# Patient Record
Sex: Female | Born: 1990 | Race: White | Hispanic: No | Marital: Married | Smoking: Former smoker
Health system: Southern US, Community
[De-identification: ages and names within clinical notes are randomized; demographics above are authoritative.]

## PROBLEM LIST (undated history)

## (undated) DIAGNOSIS — Z973 Presence of spectacles and contact lenses: Secondary | ICD-10-CM

## (undated) DIAGNOSIS — Z8759 Personal history of other complications of pregnancy, childbirth and the puerperium: Secondary | ICD-10-CM

## (undated) DIAGNOSIS — L988 Other specified disorders of the skin and subcutaneous tissue: Secondary | ICD-10-CM

## (undated) DIAGNOSIS — K219 Gastro-esophageal reflux disease without esophagitis: Secondary | ICD-10-CM

## (undated) DIAGNOSIS — J45998 Other asthma: Secondary | ICD-10-CM

## (undated) DIAGNOSIS — F53 Postpartum depression: Secondary | ICD-10-CM

## (undated) HISTORY — PX: WISDOM TOOTH EXTRACTION: SHX21

---
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
## 2010-05-24 ENCOUNTER — Emergency Department (HOSPITAL_COMMUNITY)
Admission: EM | Admit: 2010-05-24 | Discharge: 2010-05-24 | Payer: Self-pay | Source: Home / Self Care | Admitting: Emergency Medicine

## 2011-05-16 LAB — POCT URINALYSIS DIP (DEVICE)
Bilirubin Urine: NEGATIVE
Glucose, UA: NEGATIVE
Hgb urine dipstick: NEGATIVE
Ketones, ur: NEGATIVE
Nitrite: NEGATIVE
Operator id: 116391
Protein, ur: NEGATIVE
Specific Gravity, Urine: 1.015
Urobilinogen, UA: 0.2
pH: 7

## 2015-03-03 DIAGNOSIS — F17210 Nicotine dependence, cigarettes, uncomplicated: Secondary | ICD-10-CM | POA: Diagnosis present

## 2016-11-09 ENCOUNTER — Other Ambulatory Visit: Payer: Self-pay | Admitting: Family

## 2016-11-09 ENCOUNTER — Ambulatory Visit
Admission: RE | Admit: 2016-11-09 | Discharge: 2016-11-09 | Disposition: A | Discharge status: Home / Self Care | Payer: BC Managed Care – PPO | Source: Ambulatory Visit | Attending: Family | Admitting: Family

## 2016-11-09 DIAGNOSIS — R05 Cough: Secondary | ICD-10-CM

## 2016-11-09 DIAGNOSIS — R059 Cough, unspecified: Secondary | ICD-10-CM

## 2016-11-09 DIAGNOSIS — R509 Fever, unspecified: Secondary | ICD-10-CM

## 2016-11-09 DIAGNOSIS — R058 Other specified cough: Secondary | ICD-10-CM

## 2016-11-28 ENCOUNTER — Ambulatory Visit (INDEPENDENT_AMBULATORY_CARE_PROVIDER_SITE_OTHER): Payer: BC Managed Care – PPO | Admitting: Physician Assistant

## 2016-11-28 VITALS — BP 123/82 | HR 79 | Temp 98.9°F | Resp 16 | Ht 64.0 in | Wt 213.4 lb

## 2016-11-28 DIAGNOSIS — Z3009 Encounter for other general counseling and advice on contraception: Secondary | ICD-10-CM

## 2016-11-28 DIAGNOSIS — Z3046 Encounter for surveillance of implantable subdermal contraceptive: Secondary | ICD-10-CM | POA: Diagnosis not present

## 2016-11-28 DIAGNOSIS — N920 Excessive and frequent menstruation with regular cycle: Secondary | ICD-10-CM

## 2016-11-28 NOTE — Patient Instructions (Signed)
     IF you received an x-ray today, you will receive an invoice from [facility]. Please contact [facility] at [phone] with questions or concerns regarding your invoice.   IF you received labwork today, you will receive an invoice from [facility]. Please contact [facility] at [phone] with questions or concerns regarding your invoice.   Our billing staff will not be able to assist you with questions regarding bills from these companies.  You will be contacted with the lab results as soon as they are available. The fastest way to get your results is to activate your My Chart account. Instructions are located on the last page of this paperwork. If you have not heard from us regarding the results in 2 weeks, please contact this office.     

## 2016-12-03 NOTE — Progress Notes (Signed)
PRIMARY CARE AT [facility] [address] [place] [address] [phone]  Date:  11/28/2016   Name:  Lisa Powers   DOB:  [birth year]   MRN:  [id number]  PCP:  No PCP Per Patient    History of Present Illness:  Lisa Powers is a 26 y.o. female patient who presents to PCP with  Chief Complaint  Patient presents with  . Nexplanon Removal    Left arm, placed in January. Removing it bc she would like to have a baby/ period has been lasting x 1 month     Patient is here to have nexplanon removed.  She had this placed 4 months ago.  Has complaint of intermittent bleeding which has been frustrating.  more so, she states that she plans to conceive in the next up and coming months.  There are no active problems to display for this patient.   Past Medical History:  Diagnosis Date  . Allergy   . Asthma     No past surgical history on file.  Social History  Substance Use Topics  . Smoking status: Current Every Day Smoker  . Smokeless tobacco: Never Used  . Alcohol use Not on file    Family History  Problem Relation Age of Onset  . Hyperlipidemia Mother   . Hyperlipidemia Father   . Heart disease Maternal Grandmother     Allergies  Allergen Reactions  . Claritin [Loratadine]     Medication list has been reviewed and updated.  No current outpatient prescriptions on file prior to visit.   No current facility-administered medications on file prior to visit.     ROS ROS otherwise unremarkable unless listed above.  Physical Examination: BP 123/82   Pulse 79   Temp 98.9 F (37.2 C) (Oral)   Resp 16   Ht  (1.626 m)   Wt 213 lb 6.4 oz (96.8 kg)   LMP 11/28/2016   SpO2 99%   BMI 36.63 kg/m  Ideal Body Weight: Weight in (lb) to have BMI = 25: 145.3  Physical Exam  Constitutional: She is oriented to person, place, and time. She appears well-developed and well-nourished. No distress.  HENT:  Head: Normocephalic and atraumatic.  Right Ear: External ear normal.   Left Ear: External ear normal.  Eyes: Conjunctivae and EOM are normal. Pupils are equal, round, and reactive to light.  Cardiovascular: Normal rate.   Pulmonary/Chest: Effort normal. No respiratory distress.  Neurological: She is alert and oriented to person, place, and time.  Skin: She is not diaphoretic.  Palpable and visible nexplanon subdermal at the medial upper left arm.    Psychiatric: She has a normal mood and affect. Her behavior is normal.   Procedure:verbal consent obtained.  Alcohol swabbed.  1% lidocaine placed at the nexplanon site.  povidone swabbed.  11 blade utilized to place .5cm incision.  Palpated to obtain the nexplanon.  This was manipulated several times.  Opposite end with incision placed, but continued difficulty to remove.  Deeper incision was able to get the tip of nexplanon above the dermis for retrieval.  Cleansed with normal saline.  Steri strips placed, and pressure dressing.     Assessment and Plan: Lisa Powers is a 26 y.o. female who is here today for nexplanon removal.  --advised to wear condoms until menses can regulate, prior to preparing for conceiving.    Menorrhagia with regular cycle  Nexplanon removal  Family planning  Trena Platt, PA-C [facility] 4/15/20189:20 PM

## 2016-12-17 ENCOUNTER — Ambulatory Visit (HOSPITAL_COMMUNITY)
Admission: EM | Admit: 2016-12-17 | Discharge: 2016-12-17 | Disposition: A | Discharge status: Home / Self Care | Payer: BC Managed Care – PPO | Attending: Internal Medicine | Admitting: Internal Medicine

## 2016-12-17 ENCOUNTER — Encounter (HOSPITAL_COMMUNITY): Payer: Self-pay | Admitting: *Deleted

## 2016-12-17 DIAGNOSIS — L0501 Pilonidal cyst with abscess: Secondary | ICD-10-CM | POA: Diagnosis not present

## 2016-12-17 MED ORDER — CEPHALEXIN 500 MG PO CAPS: 500.0000 mg | ORAL_CAPSULE | Freq: Two times a day (BID) | ORAL | 0 refills | Status: DC

## 2016-12-17 MED ORDER — HYDROCODONE-ACETAMINOPHEN 5-325 MG PO TABS: 1.0000 | ORAL_TABLET | Freq: Four times a day (QID) | ORAL | 0 refills | Status: DC | PRN

## 2016-12-17 MED ORDER — SULFAMETHOXAZOLE-TRIMETHOPRIM 800-160 MG PO TABS: 1.0000 | ORAL_TABLET | Freq: Two times a day (BID) | ORAL | 0 refills | Status: DC

## 2016-12-17 NOTE — ED Notes (Signed)
Dr Dayton Scrape at bedside for I&D

## 2016-12-17 NOTE — ED Triage Notes (Signed)
Patient reports abscess to gluteal fold, no drainage. States has had one in the past.

## 2016-12-17 NOTE — ED Provider Notes (Signed)
[facility]    CSN: [id number] Arrival date & time: 12/17/16  1202     History   Chief Complaint Chief Complaint  Patient presents with  . Abscess    HPI Lisa Powers is a 26 y.o. female. She presents today with 3-4 day history of increasing pain in her gluteal crease. Hard to walk, painful to sit. One prior abscess, in the same location. No fever, no malaise.    HPI  Past Medical History:  Diagnosis Date  . Allergy   . Asthma     History reviewed. No pertinent surgical history.    Home Medications    Prior to Admission medications   Medication Sig Start Date End Date Taking? Authorizing Provider  cephALEXin (KEFLEX) 500 MG capsule Take 1 capsule (500 mg total) by mouth 2 (two) times daily. 12/17/16   Eustace Moore, MD  HYDROcodone-acetaminophen (NORCO/VICODIN) 5-325 MG tablet Take 1 tablet by mouth 4 (four) times daily as needed. 12/17/16   Eustace Moore, MD  Levocetirizine Dihydrochloride (XYZAL PO) Take by mouth.    Historical Provider, MD  UNABLE TO FIND Mono Histamine    Historical Provider, MD    Family History Family History  Problem Relation Age of Onset  . Hyperlipidemia Mother   . Hyperlipidemia Father   . Heart disease Maternal Grandmother     Social History Social History  Substance Use Topics  . Smoking status: Current Every Day Smoker  . Smokeless tobacco: Never Used  . Alcohol use No     Allergies   Claritin [loratadine]   Review of Systems Review of Systems  All other systems reviewed and are negative.    Physical Exam Triage Vital Signs ED Triage Vitals [12/17/16 1226]  Enc Vitals Group     BP 135/88     Pulse Rate 95     Resp 17     Temp 98.3 F (36.8 C)     Temp Source Oral     SpO2 100 %     Weight      Height      Pain Score 7     Pain Loc    Updated Vital Signs BP 135/88 (BP Location: Left Arm)   Pulse 95   Temp 98.3 F (36.8 C) (Oral)   Resp 17   LMP 11/28/2016   SpO2 100%   Physical Exam   Constitutional: She is oriented to person, place, and time. No distress.  HENT:  Head: Atraumatic.  Eyes:  Conjugate gaze observed, no eye redness/discharge  Neck: Neck supple.  Cardiovascular: Normal rate.   Pulmonary/Chest: No respiratory distress.  Abdominal: She exhibits no distension.  Musculoskeletal: Normal range of motion.  Neurological: She is alert and oriented to person, place, and time.  Skin: Skin is warm and dry.  At the top of the gluteal cleft, there is a 3 inch area of redness/induration, pointing at the lower left. No current drainage. Feels fluctuant, quite tender, fluctuance area extends about an inch outside of the erythematous area.  Nursing note and vitals reviewed.    UC Treatments / Results   Procedures Procedures (including critical care time) Skin prepped with Hibiclens, and anesthetized with ethyl chloride spray. Infiltrated with 2% lidocaine. A stab incision approximately 0.5 cm long was made. A large quantity of purulent material was expressed, and a small strip of packing placed. Antibiotic ointment and a dry dressing were applied.    Final Clinical Impressions(s) / UC Diagnoses  Final diagnoses:  Pilonidal cyst with abscess   Recheck abscess at urgent care in 2d (12/19/16).  Keep wound covered/dry until then.  If needed can add extra gauze to bandage or replace with fresh bandage.  Prescription for keflex sent to [facility] on [facility].  Prescription for a few vicodin, for severe pain, printed.  Can also take tylenol, aleve, or advil for pain.  Anticipate gradual improvement in abscess pain/redness/swelling over the next several days, may take a week or two to really resolve.  Recheck for new fever >100.5 or increasing redness/swelling/drainage/pain, especially after the first 48 hours or so.    New Prescriptions Discharge Medication List as of 12/17/2016  1:40 PM    START taking these medications   Details  cephALEXin (KEFLEX) 500 MG capsule Take  1 capsule (500 mg total) by mouth 2 (two) times daily., Starting Sun 12/17/2016, Normal         Eustace Moore, MD 12/20/16 [phone]

## 2016-12-17 NOTE — Discharge Instructions (Addendum)
Recheck abscess at urgent care in 2d (12/19/16).  Keep wound covered/dry until then.  If needed can add extra gauze to bandage or replace with fresh bandage.  Prescription for keflex sent to [facility] on [facility]  Prescription for a few vicodin, for severe pain, printed.  Can also take tylenol, aleve, or advil for pain.  Anticipate gradual improvement in abscess pain/redness/swelling over the next several days, may take a week or two to really resolve.  Recheck for new fever >100.5 or increasing redness/swelling/drainage/pain, especially after the first 48 hours or so.

## 2017-03-21 LAB — OB RESULTS CONSOLE GC/CHLAMYDIA
CHLAMYDIA, DNA PROBE: NEGATIVE
GC PROBE AMP, GENITAL: NEGATIVE

## 2017-03-21 LAB — OB RESULTS CONSOLE ABO/RH: "RH Type ": POSITIVE

## 2017-03-21 LAB — OB RESULTS CONSOLE RUBELLA ANTIBODY, IGM: Rubella: IMMUNE

## 2017-03-21 LAB — OB RESULTS CONSOLE HEPATITIS B SURFACE ANTIGEN: Hepatitis B Surface Ag: NEGATIVE

## 2017-03-21 LAB — OB RESULTS CONSOLE HIV ANTIBODY (ROUTINE TESTING): HIV: NONREACTIVE

## 2017-03-21 LAB — OB RESULTS CONSOLE ANTIBODY SCREEN: ANTIBODY SCREEN: NEGATIVE

## 2017-03-21 LAB — OB RESULTS CONSOLE RPR: RPR: NONREACTIVE

## 2017-08-21 DIAGNOSIS — F53 Postpartum depression: Secondary | ICD-10-CM

## 2017-08-21 DIAGNOSIS — O99345 Other mental disorders complicating the puerperium: Secondary | ICD-10-CM

## 2017-08-21 HISTORY — DX: Postpartum depression: F53.0

## 2017-08-21 HISTORY — DX: Other mental disorders complicating the puerperium: O99.345

## 2017-10-03 LAB — OB RESULTS CONSOLE GBS: STREP GROUP B AG: NEGATIVE

## 2017-10-07 ENCOUNTER — Inpatient Hospital Stay (HOSPITAL_COMMUNITY)
Admission: AD | Admit: 2017-10-07 | Discharge: 2017-10-07 | Disposition: A | Discharge status: Home / Self Care | Payer: BC Managed Care – PPO | Source: Ambulatory Visit | Attending: Obstetrics and Gynecology | Admitting: Obstetrics and Gynecology

## 2017-10-07 ENCOUNTER — Other Ambulatory Visit: Payer: Self-pay

## 2017-10-07 ENCOUNTER — Encounter (HOSPITAL_COMMUNITY): Payer: Self-pay

## 2017-10-07 DIAGNOSIS — R030 Elevated blood-pressure reading, without diagnosis of hypertension: Secondary | ICD-10-CM | POA: Insufficient documentation

## 2017-10-07 DIAGNOSIS — O26893 Other specified pregnancy related conditions, third trimester: Secondary | ICD-10-CM | POA: Insufficient documentation

## 2017-10-07 DIAGNOSIS — R69 Illness, unspecified: Secondary | ICD-10-CM | POA: Diagnosis not present

## 2017-10-07 DIAGNOSIS — O9989 Other specified diseases and conditions complicating pregnancy, childbirth and the puerperium: Secondary | ICD-10-CM

## 2017-10-07 DIAGNOSIS — Z3A36 36 weeks gestation of pregnancy: Secondary | ICD-10-CM | POA: Insufficient documentation

## 2017-10-07 DIAGNOSIS — R05 Cough: Secondary | ICD-10-CM | POA: Insufficient documentation

## 2017-10-07 DIAGNOSIS — J45909 Unspecified asthma, uncomplicated: Secondary | ICD-10-CM | POA: Insufficient documentation

## 2017-10-07 DIAGNOSIS — O99513 Diseases of the respiratory system complicating pregnancy, third trimester: Secondary | ICD-10-CM

## 2017-10-07 DIAGNOSIS — R0602 Shortness of breath: Secondary | ICD-10-CM | POA: Diagnosis present

## 2017-10-07 DIAGNOSIS — J111 Influenza due to unidentified influenza virus with other respiratory manifestations: Secondary | ICD-10-CM

## 2017-10-07 LAB — URINALYSIS, ROUTINE W REFLEX MICROSCOPIC
BILIRUBIN URINE: NEGATIVE
GLUCOSE, UA: NEGATIVE mg/dL
HGB URINE DIPSTICK: NEGATIVE
Ketones, ur: 5 mg/dL — AB
Leukocytes, UA: NEGATIVE
Nitrite: NEGATIVE
Protein, ur: NEGATIVE mg/dL
SPECIFIC GRAVITY, URINE: 1.011 (ref 1.005–1.030)
pH: 7 (ref 5.0–8.0)

## 2017-10-07 LAB — CBC
HCT: 36.3 % (ref 36.0–46.0)
HEMOGLOBIN: 12.3 g/dL (ref 12.0–15.0)
MCH: 30.3 pg (ref 26.0–34.0)
MCHC: 33.9 g/dL (ref 30.0–36.0)
MCV: 89.4 fL (ref 78.0–100.0)
Platelets: 164 10*3/uL (ref 150–400)
RBC: 4.06 MIL/uL (ref 3.87–5.11)
RDW: 13.4 % (ref 11.5–15.5)
WBC: 13.5 10*3/uL — ABNORMAL HIGH (ref 4.0–10.5)

## 2017-10-07 LAB — COMPREHENSIVE METABOLIC PANEL
ALBUMIN: 2.7 g/dL — AB (ref 3.5–5.0)
ALK PHOS: 121 U/L (ref 38–126)
ALT: 12 U/L — ABNORMAL LOW (ref 14–54)
ANION GAP: 9 (ref 5–15)
AST: 21 U/L (ref 15–41)
BILIRUBIN TOTAL: 0.5 mg/dL (ref 0.3–1.2)
BUN: 6 mg/dL (ref 6–20)
CALCIUM: 9.6 mg/dL (ref 8.9–10.3)
CO2: 17 mmol/L — ABNORMAL LOW (ref 22–32)
Chloride: 108 mmol/L (ref 101–111)
Creatinine, Ser: 0.9 mg/dL (ref 0.44–1.00)
GFR calc Af Amer: >60 mL/min (ref >60)
GLUCOSE: 82 mg/dL (ref 65–99)
POTASSIUM: 4 mmol/L (ref 3.5–5.1)
Sodium: 134 mmol/L — ABNORMAL LOW (ref 135–145)
Total Protein: 6.3 g/dL — ABNORMAL LOW (ref 6.5–8.1)

## 2017-10-07 LAB — INFLUENZA PANEL BY PCR (TYPE A & B)
INFLBPCR: NEGATIVE
Influenza A By PCR: NEGATIVE

## 2017-10-07 LAB — PROTEIN / CREATININE RATIO, URINE
CREATININE, URINE: 121 mg/dL
Total Protein, Urine: <6 mg/dL

## 2017-10-07 MED ORDER — ALBUTEROL SULFATE HFA 108 (90 BASE) MCG/ACT IN AERS
1.0000 | INHALATION_SPRAY | Freq: Four times a day (QID) | RESPIRATORY_TRACT | 0 refills | Status: AC | PRN
Start: 2017-10-07 — End: ?

## 2017-10-07 MED ORDER — ACETAMINOPHEN 500 MG PO TABS
1000.0000 mg | ORAL_TABLET | Freq: Once | ORAL | Status: AC
  Administered 2017-10-07: 1000 mg via ORAL
  Filled 2017-10-07: qty 2

## 2017-10-07 MED ORDER — OSELTAMIVIR PHOSPHATE 75 MG PO CAPS
75.0000 mg | ORAL_CAPSULE | Freq: Once | ORAL | Status: AC
  Administered 2017-10-07: 75 mg via ORAL
  Filled 2017-10-07: qty 1

## 2017-10-07 MED ORDER — IPRATROPIUM-ALBUTEROL 0.5-2.5 (3) MG/3ML IN SOLN
3.0000 mL | Freq: Once | RESPIRATORY_TRACT | Status: AC
  Administered 2017-10-07: 3 mL via RESPIRATORY_TRACT
  Filled 2017-10-07: qty 3

## 2017-10-07 MED ORDER — OSELTAMIVIR PHOSPHATE 75 MG PO CAPS: 75.0000 mg | ORAL_CAPSULE | Freq: Every day | ORAL | 0 refills | Status: DC

## 2017-10-07 MED ORDER — LACTATED RINGERS IV BOLUS (SEPSIS)
1000.0000 mL | Freq: Once | INTRAVENOUS | Status: AC
  Administered 2017-10-07: 1000 mL via INTRAVENOUS

## 2017-10-07 NOTE — Discharge Instructions (Signed)
Pregnancy and Influenza Influenza, also called the flu, is an infection of the respiratory tract. If you are pregnant, you are more likely to catch the flu. You are also more likely to have a more serious case of the flu. This is because pregnancy lowers your bodys ability to fight off infections (it weakens your immune system). It also puts additional stress on your heart and lungs, which makes you more likely to have complications. Having a bad case of the flu, especially with a high fever, can be dangerous for your developing baby. It can cause you to go into early labor. How do people get the flu? The flu is caused by the influenza virus. This virus is common every year in the fall and winter. It spreads when virus particles get passed from person to person. You can get the virus if you are near a sick person who is coughing or sneezing. You can also get the virus if you touch something that has the virus on it and then touch your face. How can I protect myself against the flu?  Get a flu shot. The best way to prevent the flu is to get a flu shot before flu season starts. The flu shot is not dangerous for your developing baby. It may even help protect your baby from the flu for up to 6 months after birth. The flu shot is one type of flu vaccine. Another type is a nasal spray vaccine. Do not get the nasal spray vaccine. It is not approved for pregnancy.  Do not come in close contact with sick people.  Do not share food, drinks, or utensils with other people.  Wash your hands often. Use hand sanitizer when soap and water are not available. What should I do if I have flu symptoms? If you have any flu symptoms, call your health care provider right away. Flu symptoms include:  Fever or chills.  Muscle aches.  Headache.  Sore throat.  Nasal congestion.  Cough.  Feeling tired.  Loss of appetite.  Vomiting.  Diarrhea.  You may be able to take an antiviral medicine to keep the flu  from becoming severe and to shorten how long it lasts. What should I do at home if I am diagnosed with the flu?  Do not take any medicine, including cold or flu medicine, unless directed by your health care provider.  If you take antiviral medicine, make sure you finish it even if you start to feel better.  Drink enough fluid to keep your urine clear or pale yellow.  Get plenty of rest. When would I seek immediate medical care if I have the flu?  You have trouble breathing.  You have chest pain.  You begin to have labor pains.  You have a high fever that does not go down after you take medicine.  You do not feel your baby move.  You have diarrhea or vomiting that will not go away. This information is not intended to replace advice given to you by your health care provider. Make sure you discuss any questions you have with your health care provider. Document Released: 06/09/2008 Document Revised: 01/13/2016 Document Reviewed: 07/04/2013 Elsevier Interactive Patient Education  2017 Else[employer] Inc.     Safe Medications in Pregnancy   Acne: Benzoyl Peroxide Salicylic Acid  Backache/Headache: Tylenol: 2 regular strength every 4 hours OR              2 Extra strength every 6 hours  Colds/Coughs/Allergies: Benadryl (  alcohol free) 25 mg every 6 hours as needed Breath right strips Claritin Cepacol throat lozenges Chloraseptic throat spray Cold-Eeze- up to three times per day Cough drops, alcohol free Flonase (by prescription only) Guaifenesin Mucinex Robitussin DM (plain only, alcohol free) Saline nasal spray/drops Sudafed (pseudoephedrine) & Actifed ** use only after [redacted] weeks gestation and if you do not have high blood pressure Tylenol Vicks Vaporub Zinc lozenges Zyrtec   Constipation: Colace Ducolax suppositories Fleet enema Glycerin suppositories Metamucil Milk of magnesia Miralax Senokot Smooth move tea  Diarrhea: Kaopectate Imodium A-D  *NO pepto  Bismol  Hemorrhoids: Anusol Anusol HC Preparation H Tucks  Indigestion: Tums Maalox Mylanta Zantac  Pepcid  Insomnia: Benadryl (alcohol free) 25mg  every 6 hours as needed Tylenol PM Unisom, no Gelcaps  Leg Cramps: Tums MagGel  Nausea/Vomiting:  Bonine Dramamine Emetrol Ginger extract Sea bands Meclizine  Nausea medication to take during pregnancy:  Unisom (doxylamine succinate 25 mg tablets) Take one tablet daily at bedtime. If symptoms are not adequately controlled, the dose can be increased to a maximum recommended dose of two tablets daily (1/2 tablet in the morning, 1/2 tablet mid-afternoon and one at bedtime). Vitamin B6 100mg  tablets. Take one tablet twice a day (up to 200 mg per day).  Skin Rashes: Aveeno products Benadryl cream or 25mg  every 6 hours as needed Calamine Lotion 1% cortisone cream  Yeast infection: Gyne-lotrimin 7 Monistat 7  Gum/tooth pain: Anbesol  **If taking multiple medications, please check labels to avoid duplicating the same active ingredients **take medication as directed on the label ** Do not exceed 4000 mg of tylenol in 24 hours **Do not take medications that contain aspirin or ibuprofen

## 2017-10-07 NOTE — MAU Note (Signed)
Patient presents with fever of 102.3 with Tylenol on board, chest tightness, sore throat, cough.

## 2017-10-07 NOTE — MAU Provider Note (Signed)
History     CSN: [id number]  Arrival date and time: 10/07/17 1651   First Provider Initiated Contact with Patient 10/07/17 1729      Chief Complaint  Patient presents with  . Fever  . Cough  . Shortness of Breath   HPI Lisa Powers is a 27 y.o. G1P0 at [redacted]w[redacted]d who presents with fever & cough. 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 She did receive the flu vaccine in September.  Current symptoms began yesterday. Reports nasal congestion, non productive cough, wheezing, and fever. Has been taking tylenol (took 2 RS tylenol at lunch time today) but states it hasn't brought down her temp. Temperature at home was 102. States she has hx of asthma that normally only bothers her in the spring time. She's never needed more than albuterol inhaler PRN, but states she is out of her inhaler.  Denies abdominal pain or vaginal bleeding. Positive fetal movement.   OB History    Gravida Para Term Preterm AB Living   1             SAB TAB Ectopic Multiple Live Births                  Past Medical History:  Diagnosis Date  . Allergy   . Asthma    low inhaler use    Past Surgical History:  Procedure Laterality Date  . NO PAST SURGERIES      Family History  Problem Relation Age of Onset  . Hyperlipidemia Mother   . Hyperlipidemia Father   . Heart disease Maternal Grandmother     Social History   Tobacco Use  . Smoking status: Current Every Day Smoker    Packs/day: 0.25  . Smokeless tobacco: Never Used  Substance Use Topics  . Alcohol use: No  . Drug use: No    Allergies:  Allergies  Allergen Reactions  . Claritin [Loratadine] Rash    Medications Prior to Admission  Medication Sig Dispense Refill Last Dose  . cephALEXin (KEFLEX) 500 MG capsule Take 1 capsule (500 mg total) by mouth 2 (two) times daily. 20 capsule 0   . HYDROcodone-acetaminophen (NORCO/VICODIN) 5-325 MG tablet Take 1 tablet by mouth 4 (four) times daily  as needed. 10 tablet 0   . Levocetirizine Dihydrochloride (XYZAL PO) Take by mouth.   Taking  . UNABLE TO FIND Mono Histamine   Taking    Review of Systems  Constitutional: Positive for chills and fever.  HENT: Positive for congestion, sinus pressure and sore throat. Negative for ear pain.   Eyes: Negative for visual disturbance.  Respiratory: Positive for cough, chest tightness and wheezing.   Cardiovascular: Negative for chest pain.  Gastrointestinal: Negative.   Genitourinary: Negative.   Neurological: Negative for headaches.   Physical Exam   Blood pressure 139/73, pulse (!) 118, temperature 99.9 F (37.7 C), resp. rate 20, height 5\' 4"  (1.626 m), weight 246 lb (111.6 kg), last menstrual period 11/28/2016, SpO2 97 %. Patient Vitals for the past 24 hrs:  BP Temp Temp src Pulse Resp SpO2 Height Weight  10/07/17 2002 139/73 - - (!) 118 - - - -  10/07/17 1957 - - - - - 97 % - -  10/07/17 1942 - - - - - 100 % - -  10/07/17 1939 - - - - - 96 % - -  10/07/17 1928 (!) 142/80 99.9 F (37.7 C) - (!) 107  20 - - -  10/07/17 1927 - - - - - 96 % - -  10/07/17 1700 - - - - - - 5\' 4"  (1.626 m) 246 lb (111.6 kg)  10/07/17 1658 (!) 141/75 (!) 102.3 F (39.1 C) Oral (!) 121 18 98 % - -    Physical Exam  Nursing note and vitals reviewed. Constitutional: She is oriented to person, place, and time. She appears well-developed and well-nourished. No distress.  HENT:  Head: Normocephalic and atraumatic.  Right Ear: Tympanic membrane normal.  Left Ear: Tympanic membrane normal.  Nose: Right sinus exhibits no maxillary sinus tenderness and no frontal sinus tenderness. Left sinus exhibits no maxillary sinus tenderness and no frontal sinus tenderness.  Mouth/Throat: Oropharynx is clear and moist.  Eyes: Conjunctivae are normal. Right eye exhibits no discharge. Left eye exhibits no discharge. No scleral icterus.  Neck: Normal range of motion.  Cardiovascular: Regular rhythm and normal heart sounds.  Tachycardia present.  No murmur heard. Respiratory: Effort normal. No accessory muscle usage. No tachypnea. No respiratory distress. She has wheezes (throughout).  GI: Soft. There is no tenderness.  Neurological: She is alert and oriented to person, place, and time.  Skin: Skin is warm and dry. She is not diaphoretic.  Psychiatric: She has a normal mood and affect. Her behavior is normal. Judgment and thought content normal.    MAU Course  Procedures Results for orders placed or performed during the hospital encounter of 10/07/17 (from the past 24 hour(s))  Protein / creatinine ratio, urine     Status: None   Collection Time: 10/07/17  5:00 PM  Result Value Ref Range   Creatinine, Urine 121.00 mg/dL   Total Protein, Urine <6.00 mg/dL   Protein Creatinine Ratio        0.00 - 0.15 mg/mg[Cre]  Urinalysis, Routine w reflex microscopic     Status: Abnormal   Collection Time: 10/07/17  5:00 PM  Result Value Ref Range   Color, Urine YELLOW YELLOW   APPearance CLEAR CLEAR   Specific Gravity, Urine 1.011 1.005 - 1.030   pH 7.0 5.0 - 8.0   Glucose, UA NEGATIVE NEGATIVE mg/dL   Hgb urine dipstick NEGATIVE NEGATIVE   Bilirubin Urine NEGATIVE NEGATIVE   Ketones, ur 5 (A) NEGATIVE mg/dL   Protein, ur NEGATIVE NEGATIVE mg/dL   Nitrite NEGATIVE NEGATIVE   Leukocytes, UA NEGATIVE NEGATIVE  Influenza panel by PCR (type A & B)     Status: None   Collection Time: 10/07/17  5:13 PM  Result Value Ref Range   Influenza A By PCR NEGATIVE NEGATIVE   Influenza B By PCR NEGATIVE NEGATIVE  CBC     Status: Abnormal   Collection Time: 10/07/17  6:16 PM  Result Value Ref Range   WBC 13.5 (H) 4.0 - 10.5 K/uL   RBC 4.06 3.87 - 5.11 MIL/uL   Hemoglobin 12.3 12.0 - 15.0 g/dL   HCT 16.136.3 09.636.0 - 04.546.0 %   MCV 89.4 78.0 - 100.0 fL   MCH 30.3 26.0 - 34.0 pg   MCHC 33.9 30.0 - 36.0 g/dL   RDW 40.913.4 81.111.5 - 91.415.5 %   Platelets 164 150 - 400 K/uL  Comprehensive metabolic panel     Status: Abnormal   Collection  Time: 10/07/17  6:16 PM  Result Value Ref Range   Sodium 134 (L) 135 - 145 mmol/L   Potassium 4.0 3.5 - 5.1 mmol/L   Chloride 108 101 - 111 mmol/L   CO2 17 (  L) 22 - 32 mmol/L   Glucose, Bld 82 65 - 99 mg/dL   BUN 6 6 - 20 mg/dL   Creatinine, Ser 1.61 0.44 - 1.00 mg/dL   Calcium 9.6 8.9 - 09.6 mg/dL   Total Protein 6.3 (L) 6.5 - 8.1 g/dL   Albumin 2.7 (L) 3.5 - 5.0 g/dL   AST 21 15 - 41 U/L   ALT 12 (L) 14 - 54 U/L   Alkaline Phosphatase 121 38 - 126 U/L   Total Bilirubin 0.5 0.3 - 1.2 mg/dL   GFR calc non Af Amer >60 >60 mL/min   GFR calc Af Amer >60 >60 mL/min   Anion gap 9 5 - 15    MDM NST:  Baseline: 150 bpm, Variability: Good {> 6 bpm), Accelerations: Reactive and Decelerations: Absent Flu swab collected IV fluid bolus, tylenol 1 gm & tamiflu 75 mg given Flu swab negative Duoneb breathing tx given-- pt reports improvement in symptoms & lung sounds improved. SpO2 >96% Fetal tachy cardia & maternal temp improved with IVF & tylenol. Temp down to 99.9 Pre eclampsia labs normal -- no severe range BPs C/w Dr. Elon Spanner. Notified of evaluation, treatment, labs & vital signs. Will d/c home with PEP tamiflu & albuterol inhaler  Assessment and Plan  A: 1. Influenza-like illness   2. [redacted] weeks gestation of pregnancy   3. Elevated BP without diagnosis of hypertension   4. Asthma affecting pregnancy in third trimester    P: Discharge home Rx albuterol prn Rx tamiflu 75 mg QD x 10 days Work note given Discussed reasons to return to MAU vs ED Keep scheduled f/u with OB  Judeth Horn 10/07/2017, 5:30 PM

## 2017-10-10 ENCOUNTER — Inpatient Hospital Stay (HOSPITAL_COMMUNITY): Payer: BC Managed Care – PPO

## 2017-10-10 ENCOUNTER — Inpatient Hospital Stay (HOSPITAL_COMMUNITY)
Admission: AD | Admit: 2017-10-10 | Discharge: 2017-10-10 | Disposition: A | Discharge status: Home / Self Care | Payer: BC Managed Care – PPO | Source: Ambulatory Visit | Attending: Obstetrics and Gynecology | Admitting: Obstetrics and Gynecology

## 2017-10-10 ENCOUNTER — Encounter (HOSPITAL_COMMUNITY): Payer: Self-pay | Admitting: *Deleted

## 2017-10-10 DIAGNOSIS — O26893 Other specified pregnancy related conditions, third trimester: Secondary | ICD-10-CM | POA: Insufficient documentation

## 2017-10-10 DIAGNOSIS — F17210 Nicotine dependence, cigarettes, uncomplicated: Secondary | ICD-10-CM | POA: Diagnosis not present

## 2017-10-10 DIAGNOSIS — O99513 Diseases of the respiratory system complicating pregnancy, third trimester: Secondary | ICD-10-CM | POA: Diagnosis not present

## 2017-10-10 DIAGNOSIS — R509 Fever, unspecified: Secondary | ICD-10-CM | POA: Diagnosis present

## 2017-10-10 DIAGNOSIS — J45909 Unspecified asthma, uncomplicated: Secondary | ICD-10-CM | POA: Insufficient documentation

## 2017-10-10 DIAGNOSIS — H65191 Other acute nonsuppurative otitis media, right ear: Secondary | ICD-10-CM | POA: Diagnosis not present

## 2017-10-10 DIAGNOSIS — O9989 Other specified diseases and conditions complicating pregnancy, childbirth and the puerperium: Secondary | ICD-10-CM | POA: Diagnosis not present

## 2017-10-10 DIAGNOSIS — Z79899 Other long term (current) drug therapy: Secondary | ICD-10-CM | POA: Diagnosis not present

## 2017-10-10 DIAGNOSIS — R0602 Shortness of breath: Secondary | ICD-10-CM

## 2017-10-10 DIAGNOSIS — Z3A37 37 weeks gestation of pregnancy: Secondary | ICD-10-CM | POA: Insufficient documentation

## 2017-10-10 DIAGNOSIS — J069 Acute upper respiratory infection, unspecified: Secondary | ICD-10-CM | POA: Insufficient documentation

## 2017-10-10 DIAGNOSIS — O99333 Smoking (tobacco) complicating pregnancy, third trimester: Secondary | ICD-10-CM | POA: Insufficient documentation

## 2017-10-10 DIAGNOSIS — Z888 Allergy status to other drugs, medicaments and biological substances status: Secondary | ICD-10-CM | POA: Diagnosis not present

## 2017-10-10 LAB — DIFFERENTIAL
Basophils Absolute: 0 10*3/uL (ref 0.0–0.1)
Basophils Relative: 0 %
EOS PCT: 0 %
Eosinophils Absolute: 0 10*3/uL (ref 0.0–0.7)
LYMPHS ABS: 2.4 10*3/uL (ref 0.7–4.0)
LYMPHS PCT: 14 %
Monocytes Absolute: 1.2 10*3/uL — ABNORMAL HIGH (ref 0.1–1.0)
Monocytes Relative: 7 %
NEUTROS ABS: 14.2 10*3/uL — AB (ref 1.7–7.7)
NEUTROS PCT: 79 %

## 2017-10-10 LAB — COMPREHENSIVE METABOLIC PANEL
ALBUMIN: 2.5 g/dL — AB (ref 3.5–5.0)
ALK PHOS: 126 U/L (ref 38–126)
ALT: 18 U/L (ref 14–54)
ANION GAP: 9 (ref 5–15)
AST: 29 U/L (ref 15–41)
BILIRUBIN TOTAL: 0.6 mg/dL (ref 0.3–1.2)
BUN: 5 mg/dL — AB (ref 6–20)
CALCIUM: 8.5 mg/dL — AB (ref 8.9–10.3)
CO2: 18 mmol/L — AB (ref 22–32)
Chloride: 105 mmol/L (ref 101–111)
Creatinine, Ser: 0.89 mg/dL (ref 0.44–1.00)
GFR calc Af Amer: >60 mL/min (ref >60)
GFR calc non Af Amer: >60 mL/min (ref >60)
GLUCOSE: 97 mg/dL (ref 65–99)
Potassium: 3.9 mmol/L (ref 3.5–5.1)
SODIUM: 132 mmol/L — AB (ref 135–145)
TOTAL PROTEIN: 6.1 g/dL — AB (ref 6.5–8.1)

## 2017-10-10 LAB — URINALYSIS, ROUTINE W REFLEX MICROSCOPIC
Bilirubin Urine: NEGATIVE
Glucose, UA: NEGATIVE mg/dL
Hgb urine dipstick: NEGATIVE
KETONES UR: NEGATIVE mg/dL
Leukocytes, UA: NEGATIVE
NITRITE: NEGATIVE
PROTEIN: NEGATIVE mg/dL
Specific Gravity, Urine: 1.002 — ABNORMAL LOW (ref 1.005–1.030)
pH: 7 (ref 5.0–8.0)

## 2017-10-10 LAB — PROTEIN / CREATININE RATIO, URINE
Creatinine, Urine: 46 mg/dL
Total Protein, Urine: <6 mg/dL

## 2017-10-10 LAB — CBC
HEMATOCRIT: 36.2 % (ref 36.0–46.0)
HEMOGLOBIN: 12.6 g/dL (ref 12.0–15.0)
MCH: 31 pg (ref 26.0–34.0)
MCHC: 34.8 g/dL (ref 30.0–36.0)
MCV: 89.2 fL (ref 78.0–100.0)
Platelets: 159 10*3/uL (ref 150–400)
RBC: 4.06 MIL/uL (ref 3.87–5.11)
RDW: 13.4 % (ref 11.5–15.5)
WBC: 17.5 10*3/uL — AB (ref 4.0–10.5)

## 2017-10-10 MED ORDER — IPRATROPIUM-ALBUTEROL 0.5-2.5 (3) MG/3ML IN SOLN
3.0000 mL | Freq: Once | RESPIRATORY_TRACT | Status: AC
  Administered 2017-10-10: 3 mL via RESPIRATORY_TRACT
  Filled 2017-10-10: qty 3

## 2017-10-10 MED ORDER — ACETAMINOPHEN 325 MG PO TABS
650.0000 mg | ORAL_TABLET | Freq: Once | ORAL | Status: AC
  Administered 2017-10-10: 650 mg via ORAL
  Filled 2017-10-10: qty 2

## 2017-10-10 MED ORDER — AMOXICILLIN-POT CLAVULANATE 875-125 MG PO TABS: 1.0000 | ORAL_TABLET | Freq: Two times a day (BID) | ORAL | 0 refills | Status: DC

## 2017-10-10 MED ORDER — AMOXICILLIN-POT CLAVULANATE 875-125 MG PO TABS
1.0000 | ORAL_TABLET | Freq: Once | ORAL | Status: AC
  Administered 2017-10-10: 1 via ORAL
  Filled 2017-10-10: qty 1

## 2017-10-10 MED ORDER — LACTATED RINGERS IV BOLUS (SEPSIS)
1000.0000 mL | Freq: Once | INTRAVENOUS | Status: AC
  Administered 2017-10-10: 1000 mL via INTRAVENOUS

## 2017-10-10 NOTE — MAU Provider Note (Signed)
Chief Complaint:  Fever; Shortness of Breath; and Cough   First Provider Initiated Contact with Patient 10/10/17 1801      HPI: Lisa Powers is a 27 y.o. G1P0 at [redacted]w[redacted]d who presents to maternity admissions sent from the office with ongoing fever and cough starting 10/06/17 with onset of chest tightening and shortness of breath today.  She was flu negative on 2/17 but with flu-like symptoms was prescribed Tamiflu and albuterol inhaler. She reports the fever has persisted, despite taking Tylenol. Her last dose was at 1 pm today. She also reports some pain in her left and right jaw last night that is better today. She denies pain or drainage from her ears. She reports the SOB is with exertion, making it difficult to walk very far today without having to stop. There are no other associated symptoms. She has not tried any other treatments.  She reports some elevated BP in the office in the last two weeks but denies h/a, epigastric pain, or visual disturbances. She is having intermittent mild, irregular cramping/contractions. She reports good fetal movement.   HPI  Past Medical History: Past Medical History:  Diagnosis Date  . Allergy   . Asthma    low inhaler use    Past obstetric history: OB History  Gravida Para Term Preterm AB Living  1            SAB TAB Ectopic Multiple Live Births               # Outcome Date GA Lbr Len/2nd Weight Sex Delivery Anes PTL Lv  1 Current               Past Surgical History: Past Surgical History:  Procedure Laterality Date  . NO PAST SURGERIES      Family History: Family History  Problem Relation Age of Onset  . Hyperlipidemia Mother   . Hyperlipidemia Father   . Heart disease Maternal Grandmother     Social History: Social History   Tobacco Use  . Smoking status: Current Every Day Smoker    Packs/day: 0.25  . Smokeless tobacco: Never Used  Substance Use Topics  . Alcohol use: No  . Drug use: No    Allergies:  Allergies  Allergen  Reactions  . Claritin [Loratadine] Rash    Meds:  Medications Prior to Admission  Medication Sig Dispense Refill Last Dose  . albuterol (PROVENTIL HFA;VENTOLIN HFA) 108 (90 Base) MCG/ACT inhaler Inhale 1-2 puffs into the lungs every 6 (six) hours as needed for wheezing or shortness of breath. 1 Inhaler 0   . cephALEXin (KEFLEX) 500 MG capsule Take 1 capsule (500 mg total) by mouth 2 (two) times daily. 20 capsule 0   . HYDROcodone-acetaminophen (NORCO/VICODIN) 5-325 MG tablet Take 1 tablet by mouth 4 (four) times daily as needed. 10 tablet 0   . Levocetirizine Dihydrochloride (XYZAL PO) Take by mouth.   Taking  . oseltamivir (TAMIFLU) 75 MG capsule Take 1 capsule (75 mg total) by mouth daily. 10 capsule 0   . UNABLE TO FIND Mono Histamine   Taking    ROS:  Review of Systems  Constitutional: Positive for fever. Negative for chills and fatigue.  HENT: Positive for congestion and sinus pressure.   Eyes: Negative for visual disturbance.  Respiratory: Positive for cough, chest tightness and shortness of breath.   Cardiovascular: Negative for chest pain.  Gastrointestinal: Negative for abdominal pain, nausea and vomiting.  Genitourinary: Negative for difficulty urinating, dysuria, flank pain,  pelvic pain, vaginal bleeding, vaginal discharge and vaginal pain.  Neurological: Negative for dizziness and headaches.  Psychiatric/Behavioral: Negative.      I have reviewed patient's Past Medical Hx, Surgical Hx, Family Hx, Social Hx, medications and allergies.   Physical Exam   Patient Vitals for the past 24 hrs:  BP Temp Temp src Pulse Resp SpO2  10/10/17 2104 - - - - - 95 %  10/10/17 2039 - - - - - 97 %  10/10/17 1930 - - - - - 97 %  10/10/17 1900 - - - - - 96 %  10/10/17 1845 - - - - - 99 %  10/10/17 1808 - - - - - 96 %  10/10/17 1800 122/67 - - 91 - 96 %  10/10/17 1731 131/81 - - 96 - -  10/10/17 1730 - - - - - 96 %  10/10/17 1716 133/77 - - 96 - -  10/10/17 1706 (!) 136/91 - - (!)  104 - 97 %  10/10/17 1705 - - - - - 96 %  10/10/17 1655 (!) 138/95 100 F (37.8 C) Oral (!) 109 20 97 %   Constitutional: Well-developed, well-nourished female in no acute distress.  Cardiovascular: normal rate Respiratory: normal effort GI: Abd soft, non-tender, gravid appropriate for gestational age.  MS: Extremities nontender, no edema, normal ROM Neurologic: Alert and oriented x 4.  GU: Neg CVAT.      FHT:  Baseline 155 , moderate variability, accelerations present, isolated variables x 2, resolved after IV fluids Contractions: Q 3-10 minutes, irregular, mild to palpation   Labs: Results for orders placed or performed during the hospital encounter of 10/10/17 (from the past 24 hour(s))  Urinalysis, Routine w reflex microscopic     Status: Abnormal   Collection Time: 10/10/17  4:45 PM  Result Value Ref Range   Color, Urine STRAW (A) YELLOW   APPearance CLEAR CLEAR   Specific Gravity, Urine 1.002 (L) 1.005 - 1.030   pH 7.0 5.0 - 8.0   Glucose, UA NEGATIVE NEGATIVE mg/dL   Hgb urine dipstick NEGATIVE NEGATIVE   Bilirubin Urine NEGATIVE NEGATIVE   Ketones, ur NEGATIVE NEGATIVE mg/dL   Protein, ur NEGATIVE NEGATIVE mg/dL   Nitrite NEGATIVE NEGATIVE   Leukocytes, UA NEGATIVE NEGATIVE  Protein / creatinine ratio, urine     Status: None   Collection Time: 10/10/17  4:45 PM  Result Value Ref Range   Creatinine, Urine 46.00 mg/dL   Total Protein, Urine <6 mg/dL   Protein Creatinine Ratio        0.00 - 0.15 mg/mg[Cre]  CBC     Status: Abnormal   Collection Time: 10/10/17  5:50 PM  Result Value Ref Range   WBC 17.5 (H) 4.0 - 10.5 K/uL   RBC 4.06 3.87 - 5.11 MIL/uL   Hemoglobin 12.6 12.0 - 15.0 g/dL   HCT 16.1 09.6 - 04.5 %   MCV 89.2 78.0 - 100.0 fL   MCH 31.0 26.0 - 34.0 pg   MCHC 34.8 30.0 - 36.0 g/dL   RDW 40.9 81.1 - 91.4 %   Platelets 159 150 - 400 K/uL  Comprehensive metabolic panel     Status: Abnormal   Collection Time: 10/10/17  5:50 PM  Result Value Ref Range    Sodium 132 (L) 135 - 145 mmol/L   Potassium 3.9 3.5 - 5.1 mmol/L   Chloride 105 101 - 111 mmol/L   CO2 18 (L) 22 - 32 mmol/L   Glucose, Bld  97 65 - 99 mg/dL   BUN 5 (L) 6 - 20 mg/dL   Creatinine, Ser 1.61 0.44 - 1.00 mg/dL   Calcium 8.5 (L) 8.9 - 10.3 mg/dL   Total Protein 6.1 (L) 6.5 - 8.1 g/dL   Albumin 2.5 (L) 3.5 - 5.0 g/dL   AST 29 15 - 41 U/L   ALT 18 14 - 54 U/L   Alkaline Phosphatase 126 38 - 126 U/L   Total Bilirubin 0.6 0.3 - 1.2 mg/dL   GFR calc non Af Amer >60 >60 mL/min   GFR calc Af Amer >60 >60 mL/min   Anion gap 9 5 - 15  Differential     Status: Abnormal   Collection Time: 10/10/17  5:50 PM  Result Value Ref Range   Neutrophils Relative % 79 %   Neutro Abs 14.2 (H) 1.7 - 7.7 K/uL   Lymphocytes Relative 14 %   Lymphs Abs 2.4 0.7 - 4.0 K/uL   Monocytes Relative 7 %   Monocytes Absolute 1.2 (H) 0.1 - 1.0 K/uL   Eosinophils Relative 0 %   Eosinophils Absolute 0.0 0.0 - 0.7 K/uL   Basophils Relative 0 %   Basophils Absolute 0.0 0.0 - 0.1 K/uL      Imaging:  Dg Chest 2 View  Result Date: 10/10/2017 CLINICAL DATA:  Chest pain with exhaling, cough EXAM: CHEST  2 VIEW COMPARISON:  11/09/2016 FINDINGS: The heart size and mediastinal contours are within normal limits. Both lungs are clear. The visualized skeletal structures are unremarkable. IMPRESSION: No active cardiopulmonary disease. Electronically Signed   By: Jasmine Pang M.D.   On: 10/10/2017 18:25    MAU Course/MDM: CBC, CMP, UA, CXR NST reviewed and reactive CBC with elevated WBCs and left shift, CXR normal, no evidence of pneumonia Right otitis media noted on exam Nebulizer Duoneb treatment in MAU with improved SOB Consult Dr Renaldo Fiddler with presentation, exam findings and test results.  Treat Otitis Media with abx, Augmentin 875 BID x 7 days, continue Tamiflu and albuterol inhaler PRN at home F/U in office as scheduled Note to miss work the rest of this week, return on Monday Pt discharge with strict  precautions.    Assessment: 1. Other acute nonsuppurative otitis media of right ear, recurrence not specified   2. Acute upper respiratory infection   3. SOB (shortness of breath)     Plan: Discharge home Labor precautions and fetal kick counts Follow-up Information    Zelphia Cairo, MD Follow up.   Specialty:  Obstetrics and Gynecology Why:  As scheduled, return to MAU as needed for emergencies Contact information: [address] [place], [address] [place] [address] [phone]          Allergies as of 10/10/2017      Reactions   Claritin [loratadine] Rash      Medication List    STOP taking these medications   cephALEXin 500 MG capsule Commonly known as:  KEFLEX   HYDROcodone-acetaminophen 5-325 MG tablet Commonly known as:  NORCO/VICODIN   XYZAL PO     TAKE these medications   albuterol 108 (90 Base) MCG/ACT inhaler Commonly known as:  PROVENTIL HFA;VENTOLIN HFA Inhale 1-2 puffs into the lungs every 6 (six) hours as needed for wheezing or shortness of breath.   amoxicillin-clavulanate 875-125 MG tablet Commonly known as:  AUGMENTIN Take 1 tablet by mouth 2 (two) times daily for 7 days.   oseltamivir 75 MG capsule Commonly known as:  TAMIFLU Take 1 capsule (75  mg total) by mouth daily.   UNABLE TO FIND Mono Histamine       Sharen CounterLisa Leftwich-Kirby Certified Nurse-Midwife 10/10/2017 9:23 PM

## 2017-10-10 NOTE — MAU Note (Signed)
Pt sent from MD eval for productive cough, SOB, fever.  Reports in MAU Sunday for same symptoms, Flu negative.  Pt states she's taking Tamilflu, Robitussin, and  using inhaler. Reports phlegm is yellow/green.

## 2017-10-15 ENCOUNTER — Encounter (HOSPITAL_COMMUNITY): Payer: Self-pay | Admitting: *Deleted

## 2017-10-15 ENCOUNTER — Inpatient Hospital Stay (HOSPITAL_COMMUNITY): Payer: BC Managed Care – PPO | Admitting: Anesthesiology

## 2017-10-15 ENCOUNTER — Inpatient Hospital Stay (HOSPITAL_COMMUNITY)
Admission: AD | Admit: 2017-10-15 | Discharge: 2017-10-19 | DRG: 788 | Disposition: A | Discharge status: Home / Self Care | Payer: BC Managed Care – PPO | Source: Ambulatory Visit | Attending: Obstetrics and Gynecology | Admitting: Obstetrics and Gynecology

## 2017-10-15 ENCOUNTER — Other Ambulatory Visit: Payer: Self-pay

## 2017-10-15 DIAGNOSIS — J45909 Unspecified asthma, uncomplicated: Secondary | ICD-10-CM | POA: Diagnosis present

## 2017-10-15 DIAGNOSIS — Z3A38 38 weeks gestation of pregnancy: Secondary | ICD-10-CM | POA: Diagnosis not present

## 2017-10-15 DIAGNOSIS — O99334 Smoking (tobacco) complicating childbirth: Secondary | ICD-10-CM | POA: Diagnosis present

## 2017-10-15 DIAGNOSIS — F17210 Nicotine dependence, cigarettes, uncomplicated: Secondary | ICD-10-CM | POA: Diagnosis present

## 2017-10-15 DIAGNOSIS — O9952 Diseases of the respiratory system complicating childbirth: Secondary | ICD-10-CM | POA: Diagnosis present

## 2017-10-15 DIAGNOSIS — Z3490 Encounter for supervision of normal pregnancy, unspecified, unspecified trimester: Secondary | ICD-10-CM

## 2017-10-15 DIAGNOSIS — O99214 Obesity complicating childbirth: Secondary | ICD-10-CM | POA: Diagnosis present

## 2017-10-15 DIAGNOSIS — O9081 Anemia of the puerperium: Secondary | ICD-10-CM | POA: Diagnosis not present

## 2017-10-15 DIAGNOSIS — O134 Gestational [pregnancy-induced] hypertension without significant proteinuria, complicating childbirth: Secondary | ICD-10-CM | POA: Diagnosis present

## 2017-10-15 LAB — TYPE AND SCREEN
ABO/RH(D): A POS
Antibody Screen: NEGATIVE

## 2017-10-15 LAB — COMPREHENSIVE METABOLIC PANEL
ALBUMIN: 2.7 g/dL — AB (ref 3.5–5.0)
ALK PHOS: 121 U/L (ref 38–126)
ALT: 26 U/L (ref 14–54)
ANION GAP: 10 (ref 5–15)
AST: 31 U/L (ref 15–41)
BUN: 6 mg/dL (ref 6–20)
CO2: 21 mmol/L — AB (ref 22–32)
Calcium: 9.2 mg/dL (ref 8.9–10.3)
Chloride: 102 mmol/L (ref 101–111)
Creatinine, Ser: 1.01 mg/dL — ABNORMAL HIGH (ref 0.44–1.00)
GFR calc Af Amer: >60 mL/min (ref >60)
GFR calc non Af Amer: >60 mL/min (ref >60)
GLUCOSE: 84 mg/dL (ref 65–99)
POTASSIUM: 4.1 mmol/L (ref 3.5–5.1)
SODIUM: 133 mmol/L — AB (ref 135–145)
Total Bilirubin: 0.2 mg/dL — ABNORMAL LOW (ref 0.3–1.2)
Total Protein: 6.7 g/dL (ref 6.5–8.1)

## 2017-10-15 LAB — CBC
HEMATOCRIT: 37.3 % (ref 36.0–46.0)
HEMOGLOBIN: 12.9 g/dL (ref 12.0–15.0)
MCH: 30.8 pg (ref 26.0–34.0)
MCHC: 34.6 g/dL (ref 30.0–36.0)
MCV: 89 fL (ref 78.0–100.0)
Platelets: 200 10*3/uL (ref 150–400)
RBC: 4.19 MIL/uL (ref 3.87–5.11)
RDW: 13.3 % (ref 11.5–15.5)
WBC: 10.4 10*3/uL (ref 4.0–10.5)

## 2017-10-15 LAB — PROTEIN / CREATININE RATIO, URINE
Creatinine, Urine: 74 mg/dL
Total Protein, Urine: <6 mg/dL

## 2017-10-15 LAB — ABO/RH: ABO/RH(D): A POS

## 2017-10-15 MED ORDER — EPHEDRINE 5 MG/ML INJ: 10.0000 mg | INTRAVENOUS | Status: DC | PRN

## 2017-10-15 MED ORDER — LACTATED RINGERS IV SOLN: 500.0000 mL | Freq: Once | INTRAVENOUS | Status: DC

## 2017-10-15 MED ORDER — OXYCODONE-ACETAMINOPHEN 5-325 MG PO TABS: 1.0000 | ORAL_TABLET | ORAL | Status: DC | PRN

## 2017-10-15 MED ORDER — FENTANYL 2.5 MCG/ML BUPIVACAINE 1/10 % EPIDURAL INFUSION (WH - ANES)
14.0000 mL/h | INTRAMUSCULAR | Status: DC | PRN
  Administered 2017-10-15 – 2017-10-16 (×3): 14 mL/h via EPIDURAL
  Filled 2017-10-15 (×3): qty 100

## 2017-10-15 MED ORDER — TERBUTALINE SULFATE 1 MG/ML IJ SOLN: 0.2500 mg | Freq: Once | INTRAMUSCULAR | Status: DC | PRN

## 2017-10-15 MED ORDER — OXYTOCIN 40 UNITS IN LACTATED RINGERS INFUSION - SIMPLE MED
2.5000 [IU]/h | INTRAVENOUS | Status: DC
  Filled 2017-10-15: qty 1000

## 2017-10-15 MED ORDER — OXYCODONE-ACETAMINOPHEN 5-325 MG PO TABS: 2.0000 | ORAL_TABLET | ORAL | Status: DC | PRN

## 2017-10-15 MED ORDER — ACETAMINOPHEN 325 MG PO TABS: 650.0000 mg | ORAL_TABLET | ORAL | Status: DC | PRN

## 2017-10-15 MED ORDER — ONDANSETRON HCL 4 MG/2ML IJ SOLN: 4.0000 mg | Freq: Four times a day (QID) | INTRAMUSCULAR | Status: DC | PRN

## 2017-10-15 MED ORDER — LIDOCAINE HCL (PF) 1 % IJ SOLN
30.0000 mL | INTRAMUSCULAR | Status: DC | PRN
  Filled 2017-10-15: qty 30

## 2017-10-15 MED ORDER — FENTANYL CITRATE (PF) 100 MCG/2ML IJ SOLN
100.0000 ug | INTRAMUSCULAR | Status: DC | PRN
  Administered 2017-10-15: 100 ug via INTRAVENOUS
  Filled 2017-10-15: qty 2

## 2017-10-15 MED ORDER — LIDOCAINE HCL (PF) 1 % IJ SOLN
INTRAMUSCULAR | Status: DC | PRN
  Administered 2017-10-15: 2 mL
  Administered 2017-10-15: 3 mL
  Administered 2017-10-15: 5 mL

## 2017-10-15 MED ORDER — PHENYLEPHRINE 40 MCG/ML (10ML) SYRINGE FOR IV PUSH (FOR BLOOD PRESSURE SUPPORT): 80.0000 ug | PREFILLED_SYRINGE | INTRAVENOUS | Status: DC | PRN

## 2017-10-15 MED ORDER — LACTATED RINGERS IV SOLN: 500.0000 mL | INTRAVENOUS | Status: DC | PRN

## 2017-10-15 MED ORDER — LACTATED RINGERS IV SOLN
INTRAVENOUS | Status: DC
  Administered 2017-10-15 – 2017-10-16 (×2): via INTRAVENOUS

## 2017-10-15 MED ORDER — OXYTOCIN 40 UNITS IN LACTATED RINGERS INFUSION - SIMPLE MED
1.0000 m[IU]/min | INTRAVENOUS | Status: DC
  Administered 2017-10-15: 2 m[IU]/min via INTRAVENOUS
  Administered 2017-10-16: 14 m[IU]/min via INTRAVENOUS
  Filled 2017-10-15: qty 1000

## 2017-10-15 MED ORDER — OXYTOCIN BOLUS FROM INFUSION: 500.0000 mL | Freq: Once | INTRAVENOUS | Status: DC

## 2017-10-15 MED ORDER — SOD CITRATE-CITRIC ACID 500-334 MG/5ML PO SOLN
30.0000 mL | ORAL | Status: DC | PRN
  Administered 2017-10-15 – 2017-10-16 (×4): 30 mL via ORAL
  Filled 2017-10-15 (×4): qty 15

## 2017-10-15 MED ORDER — DIPHENHYDRAMINE HCL 50 MG/ML IJ SOLN
12.5000 mg | INTRAMUSCULAR | Status: AC | PRN
  Administered 2017-10-15 – 2017-10-16 (×3): 12.5 mg via INTRAVENOUS
  Filled 2017-10-15: qty 1

## 2017-10-15 MED ORDER — PHENYLEPHRINE 40 MCG/ML (10ML) SYRINGE FOR IV PUSH (FOR BLOOD PRESSURE SUPPORT)
80.0000 ug | PREFILLED_SYRINGE | INTRAVENOUS | Status: DC | PRN
  Filled 2017-10-15: qty 10

## 2017-10-15 NOTE — Anesthesia Procedure Notes (Addendum)
Epidural Patient location during procedure: OB Start time: 10/15/2017 7:07 PM End time: 10/15/2017 7:15 PM  Staffing Anesthesiologist: Marcene DuosFitzgerald, Dyan Labarbera, MD Performed: anesthesiologist   Preanesthetic Checklist Completed: patient identified, site marked, surgical consent, pre-op evaluation, timeout performed, IV checked, risks and benefits discussed and monitors and equipment checked  Epidural Patient position: sitting Prep: site prepped and draped and DuraPrep Patient monitoring: continuous pulse ox and blood pressure Approach: midline Location: L4-L5 Injection technique: LOR air  Needle:  Needle type: Tuohy  Needle gauge: 17 G Needle length: 9 cm and 9 Needle insertion depth: 7 cm Catheter type: closed end flexible Catheter size: 19 Gauge Catheter at skin depth: 14 (12--14cm when laid in lat decub position.) cm Test dose: negative  Assessment Events: blood not aspirated, injection not painful, no injection resistance, negative IV test and no paresthesia

## 2017-10-15 NOTE — Progress Notes (Signed)
PIH labs WNL - CRT mildly above baseline but not >1.1 and not doubled. Thus, no severe features. SVE 4/100/-2, OP. Peanut ball. Continue pitocin titration.    Rosie FateE Wilmetta Speiser MD

## 2017-10-15 NOTE — Anesthesia Preprocedure Evaluation (Signed)
Anesthesia Evaluation  Patient identified by MRN, date of birth, ID band Patient awake    Reviewed: Allergy & Precautions, Patient's Chart, lab work & pertinent test results  Airway Mallampati: III  TM Distance: >3 FB     Dental   Pulmonary asthma , Current Smoker,    Pulmonary exam normal        Cardiovascular hypertension (PIH), Normal cardiovascular exam     Neuro/Psych negative neurological ROS     GI/Hepatic negative GI ROS, Neg liver ROS,   Endo/Other  Morbid obesity  Renal/GU negative Renal ROS     Musculoskeletal   Abdominal   Peds  Hematology negative hematology ROS (+)   Anesthesia Other Findings   Reproductive/Obstetrics (+) Pregnancy (IOL 2/2 PIH)                             Lab Results  Component Value Date   WBC 10.4 10/15/2017   HGB 12.9 10/15/2017   HCT 37.3 10/15/2017   MCV 89.0 10/15/2017   PLT 200 10/15/2017   Lab Results  Component Value Date   CREATININE 1.01 (H) 10/15/2017   BUN 6 10/15/2017   NA 133 (L) 10/15/2017   K 4.1 10/15/2017   CL 102 10/15/2017   CO2 21 (L) 10/15/2017    Anesthesia Physical Anesthesia Plan  ASA: III  Anesthesia Plan: Epidural   Post-op Pain Management:    Induction:   PONV Risk Score and Plan: Treatment may vary due to age or medical condition  Airway Management Planned: Natural Airway  Additional Equipment:   Intra-op Plan:   Post-operative Plan:   Informed Consent: I have reviewed the patients History and Physical, chart, labs and discussed the procedure including the risks, benefits and alternatives for the proposed anesthesia with the patient or authorized representative who has indicated his/her understanding and acceptance.     Plan Discussed with:   Anesthesia Plan Comments:         Anesthesia Quick Evaluation

## 2017-10-15 NOTE — H&P (Signed)
Lisa CheeksLeanna M Powers is a 27 y.o. female presenting for IOL s/s gHTN. In office seen for elevated Bps - last two visits BP 140s/90s. Has had a HA x 3 days which now is just resolving. PIH labs WNL in office. She is a smoker and last cigarette today (although hasn't had one in one week prior to today). OB History    Gravida Para Term Preterm AB Living   1             SAB TAB Ectopic Multiple Live Births                 Past Medical History:  Diagnosis Date  . Allergy   . Asthma    low inhaler use  . Pregnancy induced hypertension    Past Surgical History:  Procedure Laterality Date  . NO PAST SURGERIES     Family History: family history includes Heart disease in her maternal grandmother; Hyperlipidemia in her father and mother. Social History:  reports that she has been smoking.  She has been smoking about 0.25 packs per day. she has never used smokeless tobacco. She reports that she does not drink alcohol or use drugs.     Maternal Diabetes: No Genetic Screening: Normal Maternal Ultrasounds/Referrals: Normal Fetal Ultrasounds or other Referrals:  Other:  pelviectasis that resolved on last US Maternal Substance Abuse:  Yes:  Type: Smoker Significant Maternal Medications:  None Significant Maternal Lab Results:  None Other Comments:  None  ROS History Dilation: 2 Effacement (%): 80, 90 Exam by:: Dr. Elon SpannerLeger Blood pressure (!) 154/99, pulse 63, temperature 98.1 F (36.7 C), temperature source Oral, resp. rate 18, weight 110.3 kg (243 lb 4 oz), last menstrual period 11/28/2016. Exam Physical Exam   NAD, A&O NWOB Abd soft, nondistended, gravid SVE 2/80/-2, AROM for clear fluid  Prenatal labs: ABO, Rh: A/Positive/-- (08/01 0000) Antibody: Negative (08/01 0000) Rubella: Immune (08/01 0000) RPR: Nonreactive (08/01 0000)  HBsAg: Negative (08/01 0000)  HIV: Non-reactive (08/01 0000)  GBS:  Neg  Assessment/Plan: 26yo G1P0 @ 38.0 wga p/w gHTN for IOL.   # gHTN: - no current s/s  severe features - BP in office elevated to 140s/90s, 140-150s/90s here, ctm closely. - Mag prn severe features - PIH labs on admission  # IOL:  - cervix favorable - s/p AROM for clear fluid - pitocin now  # MWB:  - SMOKER. Recent cold and stopped smoking x 1 week but had two cigarettes today. Defer nicotine patch for now. Patient has previously been counseled extensively regarding cessation and has declined - Asthma - well c/w prn albuterol   # GBS neg   Ranae PilaElise Jennifer Charlann Powers 10/15/2017, 2:08 PM

## 2017-10-15 NOTE — Anesthesia Pain Management Evaluation Note (Signed)
  CRNA Pain Management Visit Note  Patient: Lisa CheeksLeanna M Self, 27 y.o., female  "Hello I am a member of the anesthesia team at [facility]. We have an anesthesia team available at all times to provide care throughout the hospital, including epidural management and anesthesia for C-section. I don't know your plan for the delivery whether it a natural birth, water birth, IV sedation, nitrous supplementation, doula or epidural, but we want to meet your pain goals."   1.Was your pain managed to your expectations on prior hospitalizations?   No prior hospitalizations  2.What is your expectation for pain management during this hospitalization?     Epidural and IV pain meds  3.How can we help you reach that goal? Possible epidural  Record the patient's initial score and the patient's pain goal.   Pain: 0  Pain Goal: 6 [facility] wants you to be able to say your pain was always managed very well.  Magdala Brahmbhatt 10/15/2017

## 2017-10-16 ENCOUNTER — Encounter (HOSPITAL_COMMUNITY)
Admission: AD | Disposition: A | Discharge status: Home / Self Care | Payer: Self-pay | Source: Ambulatory Visit | Attending: Obstetrics and Gynecology

## 2017-10-16 ENCOUNTER — Encounter (HOSPITAL_COMMUNITY): Payer: Self-pay | Admitting: General Practice

## 2017-10-16 LAB — RPR: RPR Ser Ql: NONREACTIVE

## 2017-10-16 SURGERY — Surgical Case
Anesthesia: Epidural | Site: Abdomen | Wound class: Clean Contaminated

## 2017-10-16 MED ORDER — LACTATED RINGERS IV SOLN
INTRAVENOUS | Status: DC | PRN
  Administered 2017-10-16: 10:00:00 via INTRAVENOUS

## 2017-10-16 MED ORDER — SIMETHICONE 80 MG PO CHEW
80.0000 mg | CHEWABLE_TABLET | Freq: Three times a day (TID) | ORAL | Status: DC
  Administered 2017-10-16 – 2017-10-19 (×6): 80 mg via ORAL
  Filled 2017-10-16 (×8): qty 1

## 2017-10-16 MED ORDER — MEPERIDINE HCL 25 MG/ML IJ SOLN
INTRAMUSCULAR | Status: DC | PRN
  Administered 2017-10-16: 25 mg via INTRAVENOUS

## 2017-10-16 MED ORDER — MORPHINE SULFATE (PF) 0.5 MG/ML IJ SOLN
INTRAMUSCULAR | Status: AC
  Filled 2017-10-16: qty 10

## 2017-10-16 MED ORDER — NALBUPHINE HCL 10 MG/ML IJ SOLN
5.0000 mg | Freq: Once | INTRAMUSCULAR | Status: AC | PRN
  Administered 2017-10-16: 5 mg via INTRAVENOUS
  Filled 2017-10-16: qty 1

## 2017-10-16 MED ORDER — SODIUM CHLORIDE 0.9% FLUSH: 3.0000 mL | INTRAVENOUS | Status: DC | PRN

## 2017-10-16 MED ORDER — KETOROLAC TROMETHAMINE 30 MG/ML IJ SOLN: 30.0000 mg | Freq: Once | INTRAMUSCULAR | Status: DC | PRN

## 2017-10-16 MED ORDER — DIPHENHYDRAMINE HCL 25 MG PO CAPS
25.0000 mg | ORAL_CAPSULE | ORAL | Status: DC | PRN
Start: 2017-10-16 — End: 2017-10-19

## 2017-10-16 MED ORDER — KETOROLAC TROMETHAMINE 30 MG/ML IJ SOLN
INTRAMUSCULAR | Status: AC
  Filled 2017-10-16: qty 1

## 2017-10-16 MED ORDER — SODIUM BICARBONATE 8.4 % IV SOLN
INTRAVENOUS | Status: DC | PRN
  Administered 2017-10-16 (×4): 5 mL via EPIDURAL

## 2017-10-16 MED ORDER — IBUPROFEN 600 MG PO TABS
600.0000 mg | ORAL_TABLET | Freq: Four times a day (QID) | ORAL | Status: DC
  Administered 2017-10-16 – 2017-10-19 (×12): 600 mg via ORAL
  Filled 2017-10-16 (×12): qty 1

## 2017-10-16 MED ORDER — TETANUS-DIPHTH-ACELL PERTUSSIS 5-2.5-18.5 LF-MCG/0.5 IM SUSP: 0.5000 mL | Freq: Once | INTRAMUSCULAR | Status: DC

## 2017-10-16 MED ORDER — MORPHINE SULFATE (PF) 0.5 MG/ML IJ SOLN
INTRAMUSCULAR | Status: DC | PRN
  Administered 2017-10-16: 1 mg via INTRAVENOUS
  Administered 2017-10-16: 4 mg via EPIDURAL

## 2017-10-16 MED ORDER — PRENATAL MULTIVITAMIN CH
1.0000 | ORAL_TABLET | Freq: Every day | ORAL | Status: DC
  Administered 2017-10-17 – 2017-10-19 (×3): 1 via ORAL
  Filled 2017-10-16 (×3): qty 1

## 2017-10-16 MED ORDER — SIMETHICONE 80 MG PO CHEW: 80.0000 mg | CHEWABLE_TABLET | ORAL | Status: DC | PRN

## 2017-10-16 MED ORDER — OXYTOCIN 40 UNITS IN LACTATED RINGERS INFUSION - SIMPLE MED: 2.5000 [IU]/h | INTRAVENOUS | Status: AC

## 2017-10-16 MED ORDER — DIBUCAINE 1 % RE OINT: 1.0000 "application " | TOPICAL_OINTMENT | RECTAL | Status: DC | PRN

## 2017-10-16 MED ORDER — HYDROMORPHONE HCL 1 MG/ML IJ SOLN
0.2500 mg | INTRAMUSCULAR | Status: DC | PRN
  Administered 2017-10-16: 0.5 mg via INTRAVENOUS
  Administered 2017-10-16 (×2): 0.25 mg via INTRAVENOUS

## 2017-10-16 MED ORDER — HYDROMORPHONE HCL 1 MG/ML IJ SOLN
INTRAMUSCULAR | Status: AC
  Filled 2017-10-16: qty 0.5

## 2017-10-16 MED ORDER — OXYTOCIN 10 UNIT/ML IJ SOLN
INTRAMUSCULAR | Status: AC
  Filled 2017-10-16: qty 4

## 2017-10-16 MED ORDER — PROMETHAZINE HCL 25 MG/ML IJ SOLN: 6.2500 mg | INTRAMUSCULAR | Status: DC | PRN

## 2017-10-16 MED ORDER — KETOROLAC TROMETHAMINE 30 MG/ML IJ SOLN: 30.0000 mg | Freq: Four times a day (QID) | INTRAMUSCULAR | Status: AC | PRN

## 2017-10-16 MED ORDER — ALBUTEROL SULFATE (2.5 MG/3ML) 0.083% IN NEBU: 3.0000 mL | INHALATION_SOLUTION | Freq: Four times a day (QID) | RESPIRATORY_TRACT | Status: DC | PRN

## 2017-10-16 MED ORDER — COCONUT OIL OIL
1.0000 "application " | TOPICAL_OIL | Status: DC | PRN
  Administered 2017-10-18: 1 via TOPICAL
  Filled 2017-10-16: qty 120

## 2017-10-16 MED ORDER — ONDANSETRON HCL 4 MG/2ML IJ SOLN
INTRAMUSCULAR | Status: DC | PRN
  Administered 2017-10-16: 4 mg via INTRAVENOUS

## 2017-10-16 MED ORDER — NALBUPHINE HCL 10 MG/ML IJ SOLN: 5.0000 mg | INTRAMUSCULAR | Status: DC | PRN

## 2017-10-16 MED ORDER — MENTHOL 3 MG MT LOZG: 1.0000 | LOZENGE | OROMUCOSAL | Status: DC | PRN

## 2017-10-16 MED ORDER — OXYCODONE HCL 5 MG PO TABS
5.0000 mg | ORAL_TABLET | ORAL | Status: DC | PRN
  Administered 2017-10-17 – 2017-10-19 (×7): 5 mg via ORAL
  Filled 2017-10-16 (×6): qty 1

## 2017-10-16 MED ORDER — NALOXONE HCL 4 MG/10ML IJ SOLN: 1.0000 ug/kg/h | INTRAVENOUS | Status: DC | PRN

## 2017-10-16 MED ORDER — SIMETHICONE 80 MG PO CHEW
80.0000 mg | CHEWABLE_TABLET | ORAL | Status: DC
  Administered 2017-10-16 – 2017-10-18 (×3): 80 mg via ORAL
  Filled 2017-10-16 (×3): qty 1

## 2017-10-16 MED ORDER — SCOPOLAMINE 1 MG/3DAYS TD PT72
MEDICATED_PATCH | TRANSDERMAL | Status: AC
Start: 2017-10-16 — End: 2017-10-16
  Filled 2017-10-16: qty 1

## 2017-10-16 MED ORDER — DIPHENHYDRAMINE HCL 25 MG PO CAPS
25.0000 mg | ORAL_CAPSULE | Freq: Four times a day (QID) | ORAL | Status: DC | PRN
  Administered 2017-10-17: 25 mg via ORAL
  Filled 2017-10-16: qty 1

## 2017-10-16 MED ORDER — SCOPOLAMINE 1 MG/3DAYS TD PT72
MEDICATED_PATCH | TRANSDERMAL | Status: DC | PRN
  Administered 2017-10-16: 1 via TRANSDERMAL

## 2017-10-16 MED ORDER — ONDANSETRON HCL 4 MG/2ML IJ SOLN: 4.0000 mg | Freq: Three times a day (TID) | INTRAMUSCULAR | Status: DC | PRN

## 2017-10-16 MED ORDER — KETOROLAC TROMETHAMINE 30 MG/ML IJ SOLN
30.0000 mg | Freq: Four times a day (QID) | INTRAMUSCULAR | Status: AC | PRN
  Administered 2017-10-16: 30 mg via INTRAMUSCULAR

## 2017-10-16 MED ORDER — ACETAMINOPHEN 325 MG PO TABS
650.0000 mg | ORAL_TABLET | ORAL | Status: DC | PRN
  Administered 2017-10-18 – 2017-10-19 (×6): 650 mg via ORAL
  Filled 2017-10-16 (×6): qty 2

## 2017-10-16 MED ORDER — NALBUPHINE HCL 10 MG/ML IJ SOLN: 5.0000 mg | Freq: Once | INTRAMUSCULAR | Status: AC | PRN

## 2017-10-16 MED ORDER — ONDANSETRON HCL 4 MG/2ML IJ SOLN
INTRAMUSCULAR | Status: AC
  Filled 2017-10-16: qty 2

## 2017-10-16 MED ORDER — ACETAMINOPHEN 500 MG PO TABS
1000.0000 mg | ORAL_TABLET | Freq: Four times a day (QID) | ORAL | Status: AC
  Administered 2017-10-16 – 2017-10-17 (×3): 1000 mg via ORAL
  Filled 2017-10-16 (×3): qty 2

## 2017-10-16 MED ORDER — SENNOSIDES-DOCUSATE SODIUM 8.6-50 MG PO TABS
2.0000 | ORAL_TABLET | ORAL | Status: DC
  Administered 2017-10-16 – 2017-10-18 (×3): 2 via ORAL
  Filled 2017-10-16 (×3): qty 2

## 2017-10-16 MED ORDER — OXYCODONE HCL 5 MG PO TABS
10.0000 mg | ORAL_TABLET | ORAL | Status: DC | PRN
  Administered 2017-10-18: 10 mg via ORAL
  Filled 2017-10-16 (×2): qty 2

## 2017-10-16 MED ORDER — SCOPOLAMINE 1 MG/3DAYS TD PT72: 1.0000 | MEDICATED_PATCH | Freq: Once | TRANSDERMAL | Status: DC

## 2017-10-16 MED ORDER — LACTATED RINGERS IV SOLN
INTRAVENOUS | Status: DC
  Administered 2017-10-16: 18:00:00 via INTRAVENOUS

## 2017-10-16 MED ORDER — ZOLPIDEM TARTRATE 5 MG PO TABS: 5.0000 mg | ORAL_TABLET | Freq: Every evening | ORAL | Status: DC | PRN

## 2017-10-16 MED ORDER — MEPERIDINE HCL 25 MG/ML IJ SOLN: 6.2500 mg | INTRAMUSCULAR | Status: DC | PRN

## 2017-10-16 MED ORDER — WITCH HAZEL-GLYCERIN EX PADS: 1.0000 "application " | MEDICATED_PAD | CUTANEOUS | Status: DC | PRN

## 2017-10-16 MED ORDER — MEPERIDINE HCL 25 MG/ML IJ SOLN
INTRAMUSCULAR | Status: AC
  Filled 2017-10-16: qty 1

## 2017-10-16 MED ORDER — NALOXONE HCL 0.4 MG/ML IJ SOLN: 0.4000 mg | INTRAMUSCULAR | Status: DC | PRN

## 2017-10-16 MED ORDER — SODIUM CHLORIDE 0.9 % IR SOLN
Status: DC | PRN
  Administered 2017-10-16 (×3): 1

## 2017-10-16 MED ORDER — CEFAZOLIN SODIUM-DEXTROSE 2-4 GM/100ML-% IV SOLN
INTRAVENOUS | Status: AC
  Filled 2017-10-16: qty 100

## 2017-10-16 MED ORDER — OXYTOCIN 10 UNIT/ML IJ SOLN
INTRAVENOUS | Status: DC | PRN
  Administered 2017-10-16: 40 [IU] via INTRAVENOUS

## 2017-10-16 MED ORDER — DIPHENHYDRAMINE HCL 50 MG/ML IJ SOLN: 12.5000 mg | INTRAMUSCULAR | Status: DC | PRN

## 2017-10-16 MED ORDER — CEFAZOLIN SODIUM-DEXTROSE 2-3 GM-%(50ML) IV SOLR
INTRAVENOUS | Status: DC | PRN
  Administered 2017-10-16: 2 g via INTRAVENOUS

## 2017-10-16 SURGICAL SUPPLY — 37 items
ADH SKN CLS APL DERMABOND .7 (GAUZE/BANDAGES/DRESSINGS)
APL SKNCLS STERI-STRIP NONHPOA (GAUZE/BANDAGES/DRESSINGS) ×1
BENZOIN TINCTURE PRP APPL 2/3 (GAUZE/BANDAGES/DRESSINGS) ×1 IMPLANT
CHLORAPREP W/TINT 26ML (MISCELLANEOUS) ×2 IMPLANT
CLAMP CORD UMBIL (MISCELLANEOUS) IMPLANT
CLOSURE STERI STRIP 1/2 X4 (GAUZE/BANDAGES/DRESSINGS) ×1 IMPLANT
CLOTH BEACON ORANGE TIMEOUT ST (SAFETY) ×2 IMPLANT
DERMABOND ADVANCED (GAUZE/BANDAGES/DRESSINGS)
DERMABOND ADVANCED .7 DNX12 (GAUZE/BANDAGES/DRESSINGS) IMPLANT
DRSG OPSITE POSTOP 4X10 (GAUZE/BANDAGES/DRESSINGS) ×2 IMPLANT
ELECT REM PT RETURN 9FT ADLT (ELECTROSURGICAL) ×2
ELECTRODE REM PT RTRN 9FT ADLT (ELECTROSURGICAL) ×1 IMPLANT
EXTRACTOR VACUUM M CUP 4 TUBE (SUCTIONS) IMPLANT
GAUZE SPONGE 4X4 12PLY STRL LF (GAUZE/BANDAGES/DRESSINGS) ×2 IMPLANT
GLOVE BIO SURGEON STRL SZ7.5 (GLOVE) ×2 IMPLANT
GLOVE BIOGEL PI IND STRL 7.0 (GLOVE) ×1 IMPLANT
GLOVE BIOGEL PI INDICATOR 7.0 (GLOVE) ×1
GOWN STRL REUS W/TWL LRG LVL3 (GOWN DISPOSABLE) ×4 IMPLANT
KIT ABG SYR 3ML LUER SLIP (SYRINGE) ×2 IMPLANT
NDL HYPO 25X5/8 SAFETYGLIDE (NEEDLE) ×1 IMPLANT
NEEDLE HYPO 25X5/8 SAFETYGLIDE (NEEDLE) ×2 IMPLANT
NS IRRIG 1000ML POUR BTL (IV SOLUTION) ×2 IMPLANT
PACK C SECTION WH (CUSTOM PROCEDURE TRAY) ×2 IMPLANT
PAD ABD 7.5X8 STRL (GAUZE/BANDAGES/DRESSINGS) ×1 IMPLANT
PAD OB MATERNITY 4.3X12.25 (PERSONAL CARE ITEMS) ×2 IMPLANT
PENCIL SMOKE EVAC W/HOLSTER (ELECTROSURGICAL) ×2 IMPLANT
STRIP CLOSURE SKIN 1/2X4 (GAUZE/BANDAGES/DRESSINGS) ×1 IMPLANT
SUT MNCRL 0 VIOLET CTX 36 (SUTURE) ×4 IMPLANT
SUT MONOCRYL 0 CTX 36 (SUTURE) ×4
SUT PDS AB 0 CTX 60 (SUTURE) ×2 IMPLANT
SUT PLAIN 0 NONE (SUTURE) IMPLANT
SUT PLAIN 2 0 (SUTURE)
SUT PLAIN 2 0 XLH (SUTURE) ×1 IMPLANT
SUT PLAIN ABS 2-0 CT1 27XMFL (SUTURE) IMPLANT
SUT VIC AB 4-0 KS 27 (SUTURE) ×2 IMPLANT
TOWEL OR 17X24 6PK STRL BLUE (TOWEL DISPOSABLE) ×2 IMPLANT
TRAY FOLEY BAG SILVER LF 14FR (SET/KITS/TRAYS/PACK) ×2 IMPLANT

## 2017-10-16 NOTE — Progress Notes (Signed)
Cx rim/0 to -1/LOT FHT cat one, baseline 140-150s UCs q3-4 min  Pit on IUPC placed D/W patient and husband-If UCs adequate=arrest over 3 hours>C/S                 If UCs inadequatd>optimize labor and need good progress or >C/X Patient states she understands and agrees

## 2017-10-16 NOTE — Transfer of Care (Signed)
Immediate Anesthesia Transfer of Care Note  Patient: Lisa Powers  Procedure(s) Performed: CESAREAN SECTION (N/A Abdomen)  Patient Location: PACU  Anesthesia Type:Epidural  Level of Consciousness: awake, alert  and oriented  Airway & Oxygen Therapy: Patient Spontanous Breathing  Post-op Assessment: Report given to RN and Post -op Vital signs reviewed and stable  Post vital signs: Reviewed and stable  Last Vitals:  Vitals:   10/16/17 1130 10/16/17 1145  BP: 128/79   Pulse: 75 71  Resp: 17 19  Temp:    SpO2: 97% 97%    Last Pain:  Vitals:   10/16/17 1145  TempSrc:   PainSc: 4       Patients Stated Pain Goal: 0 (10/16/17 1145)  Complications: No apparent anesthesia complications

## 2017-10-16 NOTE — Anesthesia Postprocedure Evaluation (Signed)
Anesthesia Post Note  Patient: Lisa Powers  Procedure(s) Performed: CESAREAN SECTION (N/A Abdomen)     Patient location during evaluation: PACU Anesthesia Type: Epidural Level of consciousness: awake Pain management: pain level controlled Vital Signs Assessment: post-procedure vital signs reviewed and stable Respiratory status: spontaneous breathing Cardiovascular status: stable Postop Assessment: no headache, no backache, epidural receding, patient able to bend at knees and no apparent nausea or vomiting Anesthetic complications: no    Last Vitals:  Vitals:   10/16/17 1200 10/16/17 1215  BP: 139/84 139/84  Pulse: 71 69  Resp: 14 16  Temp:    SpO2: 97% 96%    Last Pain:  Vitals:   10/16/17 1215  TempSrc:   PainSc: 2    Pain Goal: Patients Stated Pain Goal: 0 (10/16/17 1200)  LLE Motor Response: Purposeful movement (10/16/17 1215) LLE Sensation: Numbness;Tingling (10/16/17 1215) RLE Motor Response: Purposeful movement (10/16/17 1215) RLE Sensation: Numbness;Tingling (10/16/17 1215)      Adaia Matthies JR,JOHN Susann GivensFRANKLIN

## 2017-10-16 NOTE — Plan of Care (Signed)
Progressing appropriately. Up to bathroom with assistance. Encouraged to call for assistance with feeding as needed, and for [facility].

## 2017-10-16 NOTE — Brief Op Note (Signed)
10/15/2017 - 10/16/2017  10:28 AM  PATIENT:  Lisa Powers  27 y.o. female  PRE-OPERATIVE DIAGNOSIS:  Primary cesarean section for arrest of dilation  POST-OPERATIVE DIAGNOSIS:  Primary cesarean section for arrest of dilation  PROCEDURE:  Procedure(s): CESAREAN SECTION (N/A)  SURGEON:  Surgeon(s) and Role:    * Harold Hedgeomblin, Rory Montel, MD - Primary  PHYSICIAN ASSISTANT:   ASSISTANTS: none   ANESTHESIA:   epidural  EBL:  576 mL   BLOOD ADMINISTERED:none  DRAINS: Urinary Catheter (Foley)   LOCAL MEDICATIONS USED:  NONE  SPECIMEN:  No Specimen  DISPOSITION OF SPECIMEN:  N/A  COUNTS:  YES  TOURNIQUET:  * No tourniquets in log *  DICTATION: .Other Dictation: Dictation Number [id number]  PLAN OF CARE: Admit to inpatient   PATIENT DISPOSITION:  PACU - hemodynamically stable.   Delay start of Pharmacological VTE agent (>24hrs) due to surgical blood loss or risk of bleeding: not applicable

## 2017-10-16 NOTE — Progress Notes (Signed)
UCs adequate FHT cat one Cx no change A/P: D//W patient arrest of dilation. Recommend cesarean section. D/W risks including infection, organ damage, bleeding/transfusion_HIV/Hep, DVT/PE, pneumonia. Patient states she understands and agrees.

## 2017-10-16 NOTE — Addendum Note (Signed)
Addendum  created 10/16/17 1753 by Graciela HusbandsFussell, Nyxon Strupp O, CRNA   Sign clinical note

## 2017-10-16 NOTE — Plan of Care (Signed)
Patient progressing well post-op. Will continue to monitor.

## 2017-10-16 NOTE — Anesthesia Postprocedure Evaluation (Signed)
Anesthesia Post Note  Patient: Lisa Powers  Procedure(s) Performed: CESAREAN SECTION (N/A Abdomen)     Patient location during evaluation: Mother Baby Anesthesia Type: Epidural Level of consciousness: awake and alert and oriented Pain management: satisfactory to patient Vital Signs Assessment: post-procedure vital signs reviewed and stable Respiratory status: respiratory function stable Cardiovascular status: stable Postop Assessment: no headache, no backache, epidural receding, patient able to bend at knees, no signs of nausea or vomiting and adequate PO intake Anesthetic complications: no    Last Vitals:  Vitals:   10/16/17 1540 10/16/17 1700  BP: 129/87   Pulse: (!) 58   Resp: 17   Temp: 36.9 C   SpO2: 100% 97%    Last Pain:  Vitals:   10/16/17 1540  TempSrc: Oral  PainSc: 0-No pain   Pain Goal: Patients Stated Pain Goal: 0 (10/16/17 1200)               Karem Tomaso

## 2017-10-17 LAB — CBC
HCT: 26.4 % — ABNORMAL LOW (ref 36.0–46.0)
HEMOGLOBIN: 9.1 g/dL — AB (ref 12.0–15.0)
MCH: 31.2 pg (ref 26.0–34.0)
MCHC: 34.5 g/dL (ref 30.0–36.0)
MCV: 90.4 fL (ref 78.0–100.0)
Platelets: 136 10*3/uL — ABNORMAL LOW (ref 150–400)
RBC: 2.92 MIL/uL — ABNORMAL LOW (ref 3.87–5.11)
RDW: 13.6 % (ref 11.5–15.5)
WBC: 14.3 10*3/uL — ABNORMAL HIGH (ref 4.0–10.5)

## 2017-10-17 NOTE — Progress Notes (Signed)
Subjective: Postpartum Day 1: Cesarean Delivery Patient reports incisional pain, tolerating PO and no problems voiding.    Objective: Vital signs in last 24 hours: Temp:  [97.9 F (36.6 C)-98.8 F (37.1 C)] 98.4 F (36.9 C) (02/27 0905) Pulse Rate:  [58-80] 65 (02/27 0905) Resp:  [14-25] 17 (02/27 0905) BP: (120-140)/(68-87) 124/81 (02/27 0905) SpO2:  [95 %-100 %] 97 % (02/27 0520)  Physical Exam:  General: alert, cooperative, appears stated age and no distress Lochia: appropriate Uterine Fundus: firm Incision: healing well DVT Evaluation: No evidence of DVT seen on physical exam.  Recent Labs    10/15/17 1326 10/17/17 0557  HGB 12.9 9.1*  HCT 37.3 26.4*    Assessment/Plan: Status post Cesarean section. Doing well postoperatively.  Continue current care Circ today.  Leonel Mccollum C 10/17/2017, 9:16 AM

## 2017-10-17 NOTE — Lactation Note (Signed)
This note was copied from a baby's chart. Lactation Consultation Note  Patient Name: Lisa Powers Self Today's Date: 10/17/2017   Mom tearful, concerned about infant's intake. She says that infant is feeding better today than yesterday, but Mom's description of feedings suggest possible insufficient intake. Infant's most recent diaper pulled out of trash can. Urine is slightly concentrated. It is possible that the infant has had more wet diapers than charted. Dad reports being color-blind & says he wouldn't have noticed a void in a diaper that wasn't heavy with urine.   Mom reports + breast changes w/pregnancy. Infant is currently sleeping. Mom has my # to call when "[name]" is ready to feed.   Lurline HareRichey, Eland Lamantia [facility] 10/17/2017, 5:22 PM

## 2017-10-17 NOTE — Op Note (Signed)
NAME:  Lisa Powers, Lisa Powers                      ACCOUNT NO.:  MEDICAL RECORD NO.:  [id number]  LOCATION:                                 FACILITY:  PHYSICIAN:  Guy [id number] E. Henderson Cloudomblin, M.D.      DATE OF BIRTH:  DATE OF PROCEDURE:  10/16/2017 DATE OF DISCHARGE:                              OPERATIVE REPORT   PREOPERATIVE DIAGNOSIS:  Arrest of dilation.  POSTOPERATIVE DIAGNOSIS:  Arrest of dilation.  PROCEDURE:  Primary cesarean section.  SURGEON:  Guy SandiferJames E. Henderson Cloudomblin, M.D.  ANESTHESIA:  Epidural.  ESTIMATED BLOOD LOSS:  750 mL.  SPECIMENS:  None.  FINDINGS:  Viable female infant.  Apgar, arterial cord pH, birth weight, pending.  INDICATIONS AND CONSENT:  This patient is a 27 year old, G1, P0, at 38/1, who is progressing in labor to a rim and 0 to -1 station.  There is no change after adequate labor has been documented.  Recommendation for cesarean section was made.  Potential risks and complications were reviewed preoperatively including, but not limited to infection, organ damage, bleeding requiring transfusion of blood products with HIV and hepatitis acquisition, DVT, PE, pneumonia.  She understands and agrees and consent is signed on the chart.  DESCRIPTION OF PROCEDURE:  The patient was taken to the operating room where she was identified and her epidural anesthetic was augmented to a surgical level.  She was placed in a dorsal supine position with a 15- degree left lateral wedge.  She was prepped vaginally with Betadine. Foley catheter was placed and prepped abdominally with ChloraPrep.  Time- out undertaken.  After 3 minute drying time, she was draped in a sterile fashion.  After testing for adequate epidural anesthesia, skin was entered through a Pfannenstiel incision and dissection was carried out in layers.  The vesicouterine peritoneum was taken down bilaterally and a bladder flap developed.  Uterus was incised in a low transverse manner, and the uterine cavity was entered  bluntly with a hemostat.  The uterine incision was extended with the fingers.  The vertex was delivered without difficulty.  Baby was delivered with good cry and tone noted.  After 1 minute waiting time, the cord was clamped and cut.  The baby was handed to awaiting pediatrics team.  Placenta was manually delivered.  Uterus was clean.  Uterus was closed in 2 running locking imbricating layers of 0 Monocryl suture.  A single 2-0 Vicryl was placed in the center of the incision to obtain complete hemostasis.  Tubes and ovaries were normal bilaterally.  Lavage was carried out.  Anterior peritoneum was closed in a running fashion with 0 Monocryl suture which was used to reapproximate the pyramidalis muscle in midline.  Anterior rectus fascia was closed in a running fashion with a 0 looped PDS. Subcutaneous layer was closed with interrupted plain and the skin was closed in a subcuticular fashion with 4-0 Vicryl on a Keith needle.  Benzoin, Steri-Strips, honeycomb and pressure dressing was applied.  All counts were correct.  The patient was taken to recovery room in stable condition.     Guy SandiferJames E. Henderson Cloudomblin, M.D.     JET/MEDQ  D:  10/16/2017  T:  10/16/2017  Job:  [id number]

## 2017-10-17 NOTE — Lactation Note (Signed)
This note was copied from a baby's chart. Lactation Consultation Note Baby 17 hrs old. Mom trying to BF baby in cradle position STS when LC arrived in rm. Noted retracting to abd. Head moving up and down w/breathing. Called for RN. Nursery RN came to assess baby. Respirations 88, O2 sats WDL. Encouraged to keep baby STS.  Baby laying on side between moms breast. Baby would have occasional "Snubs" (triple breaths). Repositioned to back between breast. Attempted to count HR, beating to fast for LC to count. Respirations 90. Temp 99.1 notified charge nurse d/t staff Rn in w/new pt.  Baby rooting. D/t respirations LC didn't latch. LC hand expressed breast w/semi flat very short shaft compressible nipples. Easy flow of colostrum. Hand expressed 1 ml. Dipped gloved finger into colostrum and let baby suckle on finger. Baby has uncoordinated suck.  Newborn feeding habits, positioning, STS, I&O, cluster feeding discussed. Mom encouraged to feed baby 8-12 times/24 hours and with feeding cues.  [facility]/LC brochure given w/resources, support groups and LC services.  Staff RN came to assess later to find vs WDL.   Patient Name: Lisa Powers Today's Date: 10/17/2017 Reason for consult: Initial assessment   Maternal Data Has patient been taught Hand Expression?: Yes Does the patient have breastfeeding experience prior to this delivery?: No  Feeding Feeding Type: Breast Milk  LATCH Score Latch: Too sleepy or reluctant, no latch achieved, no sucking elicited.  Audible Swallowing: None  Type of Nipple: Flat  Comfort (Breast/Nipple): Soft / non-tender  Hold (Positioning): Assistance needed to correctly position infant at breast and maintain latch.  LATCH Score: 8  Interventions Interventions: Breast feeding basics reviewed;Support pillows;Breast massage;Hand express;Position options;Expressed milk;Pre-pump if needed(unable to BF d/t increased respirations)  Lactation Tools Discussed/Used Tools:  Pump Breast pump type: Manual Pump Review: Setup, frequency, and cleaning;Milk Storage Initiated by:: Peri JeffersonL. Pervis Macintyre RN IBCLC Date initiated:: 10/17/17   Consult Status Consult Status: Follow-up Date: 10/17/17 Follow-up type: In-patient    [name], Diamond NickelLAURA G 10/17/2017, 3:05 AM

## 2017-10-18 NOTE — Lactation Note (Signed)
This note was copied from a baby's chart. Lactation Consultation Note Baby 40 hrs old. Cluster feeding. Aggressive at breast. Mom holding baby in cross cradle position sitting in recliner. mom using "V" hold pulling breast tissue back not allowing baby to obtain deep latch and baby getting frustrated searching for short shaft nipple. Encouraged mom to use "C" hold and cup breast like sand which to allow baby to obtain deeper latch. Mom did so and baby latched well. Then baby needed to burp, came off breast, mom again pulling breast tissue back. encouraged to allow breast tissue to relax so it will go deeper into mouth and baby can get more milk and will not hurt nipples.  Mom tense, not using support for back. Mom isn't using the hand to hold the baby to bring baby to breast. Mom expects baby to get on.  Reposition baby in football, placed pillows behind mom's back for comfort. Mom using BF pillow working well. Baby obtained deep latch. Mom relaxing better. Baby feeding better.  Resp. Slightly elevated, d/t baby searching for breast and not still. LC unable to get resp. RN came into rm and will check.  Patient Name: Vinie SillBoy Lyle Self Today's Date: 10/18/2017 Reason for consult: Follow-up assessment   Maternal Data    Feeding Feeding Type: Breast Fed Length of feed: 15 min(still BF)  LATCH Score Latch: Repeated attempts needed to sustain latch, nipple held in mouth throughout feeding, stimulation needed to elicit sucking reflex.  Audible Swallowing: Spontaneous and intermittent  Type of Nipple: Everted at rest and after stimulation  Comfort (Breast/Nipple): Soft / non-tender  Hold (Positioning): Assistance needed to correctly position infant at breast and maintain latch.  LATCH Score: 8  Interventions Interventions: Breast feeding basics reviewed;Assisted with latch;Breast compression;Adjust position;Skin to skin;Breast massage;Support pillows;Hand express;Position options  Lactation  Tools Discussed/Used     Consult Status Consult Status: Follow-up Date: 10/18/17 Follow-up type: In-patient    Malavika Lira, Diamond NickelLAURA G 10/18/2017, 1:59 AM

## 2017-10-18 NOTE — Progress Notes (Signed)
Patient doing well. Baby is being evaluated by Pediatrician for increased respirations and increased bilirubin. Possible ECHO today. Circ has been postponed.  BP (!) 148/70 (BP Location: Left Arm)   Pulse 89   Temp 97.8 F (36.6 C) (Oral)   Resp 20   Ht 5\' 4"  (1.626 m)   Wt 110.3 kg (243 lb 4 oz)   LMP 11/28/2016   SpO2 97%   Breastfeeding? Unknown   BMI 41.75 kg/m  No results found for this or any previous visit (from the past 24 hour(s)). Abdomen is soft and non tender  POD # 2 Doing well Routine care

## 2017-10-19 MED ORDER — IBUPROFEN 600 MG PO TABS: 600.0000 mg | ORAL_TABLET | Freq: Four times a day (QID) | ORAL | 0 refills | Status: DC

## 2017-10-19 MED ORDER — OXYCODONE-ACETAMINOPHEN 5-325 MG PO TABS: 1.0000 | ORAL_TABLET | ORAL | 0 refills | Status: DC | PRN

## 2017-10-19 NOTE — Progress Notes (Signed)
Subjective: Postpartum Day 3: Cesarean Delivery Patient reports tolerating PO, + flatus and no problems voiding.  Infant doing better, on bili lights  Objective: Vital signs in last 24 hours: Temp:  [98.5 F (36.9 C)] 98.5 F (36.9 C) (02/28 1827) Pulse Rate:  [64] 64 (02/28 1827) Resp:  [18] 18 (02/28 1827) BP: (133)/(76) 133/76 (02/28 1827) SpO2:  [97 %] 97 % (02/28 1827)  Physical Exam:  General: alert and cooperative Lochia: appropriate Uterine Fundus: firm Incision: healing well, no significant drainage DVT Evaluation: No evidence of DVT seen on physical exam.  Recent Labs    10/17/17 0557  HGB 9.1*  HCT 26.4*    Assessment/Plan: Status post Cesarean section. Doing well postoperatively.  Discharge home with standard precautions and return to clinic in 4-6 weeks. Will do circ when ok by peds  Lisa Powers 10/19/2017, 8:02 AM

## 2017-10-19 NOTE — Lactation Note (Signed)
This note was copied from a baby's chart. Lactation Consultation Note  Patient Name: Lisa SillBoy Alexandera Powers Today's Date: 10/19/2017 Reason for consult: Initial assessment;Follow-up assessment;Early term 37-38.6wks;Primapara;1st time breastfeeding;Mother's request;Difficult latch  2280 hours old female who is being partially BF and formula fed by his mother. Mom requested LC help because she couldn't get baby to latch on left nipple. Mom has asymmetrical breasts; baby latches on well on right breast, but not on the left one, which is noticeable the bigger breast.  Mom had left breast full at the time of consultation, did some hand expression and milk expressed very easily. Had to do some breast massage and breast compressions to get milk out of the right breast. Mom is going home today, RN took baby to nursery because he was bleeding from circumcision.   Unable to assess latch at this point. Advised parents to try to latch baby on with a NS # 20. Mom also requested a # 24 to take home in case she needs to be resized. Discussed some techniques and breastfeeding positions to try. Reviewed engorgement prevention and treatment. Mom will keep pumping and emptying left breast in case baby is still not latching on with NS and will call LC services if needed.   Interventions Interventions: Breast feeding basics reviewed;Breast massage;Breast compression;Hand express;Expressed milk  Lactation Tools Discussed/Used Tools: Nipple Shields Nipple shield size: 20;24   Consult Status Consult Status: Complete    Cleota Pellerito S Jaki Hammerschmidt 10/19/2017, 6:18 PM

## 2017-10-19 NOTE — Discharge Summary (Signed)
Obstetric Discharge Summary Reason for Admission: induction of labor Prenatal Procedures:  Intrapartum Procedures: cesarean: low cervical, transverse Postpartum Procedures: none Complications-Operative and Postpartum: none Hemoglobin  Date Value Ref Range Status  10/17/2017 9.1 (L) 12.0 - 15.0 g/dL Final    Comment:    REPEATED TO VERIFY DELTA CHECK NOTED    HCT  Date Value Ref Range Status  10/17/2017 26.4 (L) 36.0 - 46.0 % Final    Physical Exam:  General: alert and cooperative Lochia: appropriate Uterine Fundus: firm Incision: healing well, no significant drainage DVT Evaluation: No evidence of DVT seen on physical exam.  Discharge Diagnoses: Term Pregnancy-delivered  Discharge Information: Date: 10/19/2017 Activity: pelvic rest Diet: routine Medications: PNV, Ibuprofen and Percocet Condition: stable Instructions: refer to practice specific booklet Discharge to: home Follow-up Information    [place], [facility] Schedule an appointment as soon as possible for a visit in 1 week(s).   Contact information: [address] [place] [address] [phone]           Newborn Data: Live born female  Birth Weight: 8 lb 0.6 oz (3645 g) APGAR: 9, 10  Newborn Delivery   Birth date/time:  10/16/2017 09:57:00 Delivery type:  C-Section, Low Transverse C-section categorization:  Primary     Zelphia CairoGretchen Ashish Rossetti 10/19/2017, 8:06 AM

## 2017-10-23 ENCOUNTER — Encounter (HOSPITAL_COMMUNITY): Payer: Self-pay | Admitting: *Deleted

## 2017-10-23 ENCOUNTER — Inpatient Hospital Stay (HOSPITAL_COMMUNITY)
Admission: AD | Admit: 2017-10-23 | Discharge: 2017-10-24 | Disposition: A | Discharge status: Home / Self Care | Payer: BC Managed Care – PPO | Source: Ambulatory Visit | Attending: Obstetrics and Gynecology | Admitting: Obstetrics and Gynecology

## 2017-10-23 DIAGNOSIS — Z9889 Other specified postprocedural states: Secondary | ICD-10-CM | POA: Insufficient documentation

## 2017-10-23 DIAGNOSIS — Z79899 Other long term (current) drug therapy: Secondary | ICD-10-CM | POA: Insufficient documentation

## 2017-10-23 DIAGNOSIS — Z8249 Family history of ischemic heart disease and other diseases of the circulatory system: Secondary | ICD-10-CM | POA: Insufficient documentation

## 2017-10-23 DIAGNOSIS — F17210 Nicotine dependence, cigarettes, uncomplicated: Secondary | ICD-10-CM | POA: Insufficient documentation

## 2017-10-23 DIAGNOSIS — Z83438 Family history of other disorder of lipoprotein metabolism and other lipidemia: Secondary | ICD-10-CM | POA: Insufficient documentation

## 2017-10-23 DIAGNOSIS — Z888 Allergy status to other drugs, medicaments and biological substances status: Secondary | ICD-10-CM | POA: Insufficient documentation

## 2017-10-23 DIAGNOSIS — R239 Unspecified skin changes: Secondary | ICD-10-CM

## 2017-10-23 DIAGNOSIS — T8189XA Other complications of procedures, not elsewhere classified, initial encounter: Secondary | ICD-10-CM

## 2017-10-23 NOTE — MAU Note (Addendum)
HAD C/S ON 2-26 - BY  DR TOMBLIN    PT  SAYS  AT 930PM-  SHE WAS PUMPING   AND WENT  TO B-ROOM- PANTS HAD BLOOD IN FRONT.    IN TRIAGE- PT HAS TOWEL  IN PANTS - SMALL AREA OOZING  RED BLOOD.   WENT  TO DR TODAY- STERISTRIPS AND HONEYCOMB REMOVED.    HAS HAD PAIN ON RIGHT SIDE SINCE LAST Tuesday.     TOOK MEDS AT 9PM AND  8PM

## 2017-10-23 NOTE — MAU Provider Note (Signed)
Chief Complaint:  Drainage from Incision   First Provider Initiated Contact with Patient 10/23/17 2336      HPI: Lisa Powers is a 27 y.o. G1P1001 who presents to maternity admissions reporting leaking of blood from her cesarean incision.  Was seen in office today and Honeycomb was removed, as were steristrips. Is one week postop.  Right side of incision has been more painful.  Noticed bloody drainage which soaked her clothing tonight.  States it spurted out to the front.  This is new bleeding. . She reports vaginal bleeding, but no vaginal itching/burning, urinary symptoms, h/a, dizziness, n/v, or fever/chills.    Other  This is a new problem. The current episode started today. The problem occurs intermittently. The problem has been unchanged. Associated symptoms include abdominal pain (incisional). Pertinent negatives include no chills, congestion, fever, headaches, myalgias, nausea, visual change or vomiting. Exacerbated by: pressure on wound. She has tried nothing for the symptoms.    RN Note:  HAD C/S ON 2-26 - BY  DR TOMBLIN    PT  SAYS  AT 930PM-  SHE WAS PUMPING   AND WENT  TO B-ROOM- PANTS HAD BLOOD IN FRONT.    IN TRIAGE- PT HAS TOWEL  IN PANTS - SMALL AREA OOZING  RED BLOOD.   WENT  TO DR TODAY- STERISTRIPS AND HONEYCOMB REMOVED.    HAS HAD PAIN ON RIGHT SIDE SINCE LAST Tuesday.     TOOK MEDS AT 9PM AND  8PM   Past Medical History: Past Medical History:  Diagnosis Date  . Allergy   . Asthma    low inhaler use  . Pregnancy induced hypertension     Past obstetric history: OB History  Gravida Para Term Preterm AB Living  1 1 1     1   SAB TAB Ectopic Multiple Live Births        0 1    # Outcome Date GA Lbr Len/2nd Weight Sex Delivery Anes PTL Lv  1 Term 10/16/17 [redacted]w[redacted]d  8 lb 0.6 oz (3.645 kg) M CS-LTranv EPI  LIV      Past Surgical History: Past Surgical History:  Procedure Laterality Date  . CESAREAN SECTION N/A 10/16/2017   Procedure: CESAREAN SECTION;  Surgeon:  Harold Hedge, MD;  Location: [facility] BIRTHING SUITES;  Service: Obstetrics;  Laterality: N/A;  . NO PAST SURGERIES      Family History: Family History  Problem Relation Age of Onset  . Hyperlipidemia Mother   . Hyperlipidemia Father   . Heart disease Maternal Grandmother     Social History: Social History   Tobacco Use  . Smoking status: Current Every Day Smoker    Packs/day: 0.25  . Smokeless tobacco: Never Used  Substance Use Topics  . Alcohol use: No  . Drug use: No    Allergies:  Allergies  Allergen Reactions  . Claritin [Loratadine] Rash    Meds:  Medications Prior to Admission  Medication Sig Dispense Refill Last Dose  . albuterol (PROVENTIL HFA;VENTOLIN HFA) 108 (90 Base) MCG/ACT inhaler Inhale 1-2 puffs into the lungs every 6 (six) hours as needed for wheezing or shortness of breath. 1 Inhaler 0 Past Month at Unknown time  . ibuprofen (ADVIL,MOTRIN) 600 MG tablet Take 1 tablet (600 mg total) by mouth every 6 (six) hours. 30 tablet 0 10/23/2017 at 2000  . oxyCODONE-acetaminophen (PERCOCET/ROXICET) 5-325 MG tablet Take 1-2 tablets by mouth every 4 (four) hours as needed for severe pain. 15 tablet 0 10/23/2017 at 2100  .  Prenatal Vit-Fe Fumarate-FA (PRENATAL MULTIVITAMIN) TABS tablet Take 1 tablet by mouth daily at 12 noon.   10/22/2017 at Unknown time  . oseltamivir (TAMIFLU) 75 MG capsule Take 1 capsule (75 mg total) by mouth daily. 10 capsule 0 10/14/2017 at Unknown time    I have reviewed patient's Past Medical Hx, Surgical Hx, Family Hx, Social Hx, medications and allergies.  ROS:  Review of Systems  Constitutional: Negative for chills and fever.  HENT: Negative for congestion.   Gastrointestinal: Positive for abdominal pain (incisional). Negative for nausea and vomiting.  Musculoskeletal: Negative for myalgias.  Neurological: Negative for headaches.   Other systems negative     Physical Exam   Patient Vitals for the past 24 hrs:  BP Temp Temp src Pulse Resp  Height Weight  10/23/17 2325 132/82 98.7 F (37.1 C) Oral 74 20 5\' 4"  (1.626 m) 230 lb 8 oz (104.6 kg)   Constitutional: Well-developed, well-nourished female in no acute distress.  Cardiovascular: normal rate and rhythm, no ectopy audible, S1 & S2 heard, no murmur Respiratory: normal effort, no distress. Lungs CTAB with no wheezes or crackles GI: Abd soft, appropriately tender at incision.  Nondistended.  No rebound, No guarding.  Bowel Sounds audible  Incision:   Well approximated and healing well.  No erethema.  There is one pinpoint spot just to right side of incision which yields a spot of blood with pressure.  Only enough to fill a 2x2.at a time. MS: Extremities nontender, no edema, normal ROM Neurologic: Alert and oriented x 4.   Grossly nonfocal. GU: Neg CVAT. Skin:  Warm and Dry Psych:  Affect appropriate.  PELVIC EXAM: deferred   Labs: No results found for this or any previous visit (from the past 24 hour(s)). --/--/A POS, A POS Performed at [facility], [address] [place], [place] [address]  (02/25 1326)  Imaging:    MAU Course/MDM: I have ordered labs as follows:none Imaging ordered: none   Consult Dr Elon SpannerLeger with presentation and exam findings.  She suggests using silver nitrate stick on area and dressing it.  .   Treatments in MAU included Silver nitrate to spot where blood came from with resolution of bleeding..   Pt stable at time of discharge.  Assessment: One week postop cesarean delivery Bleeding from incision, possible seroma vs edge of incision bleeding  Plan: Discharge home Recommend keep area covered until tomorrow Call office if bleeding becomes profuse again. Follow up with office as scheduled  Encouraged to return here or to other Urgent Care/ED if she develops worsening of symptoms, increase in pain, fever, or other concerning symptoms.   Wynelle BourgeoisMarie Alford Gamero CNM, MSN Certified Nurse-Midwife 10/23/2017 11:36 PM

## 2017-10-24 DIAGNOSIS — F17210 Nicotine dependence, cigarettes, uncomplicated: Secondary | ICD-10-CM | POA: Diagnosis not present

## 2017-10-24 DIAGNOSIS — Z9889 Other specified postprocedural states: Secondary | ICD-10-CM | POA: Diagnosis not present

## 2017-10-24 DIAGNOSIS — Z83438 Family history of other disorder of lipoprotein metabolism and other lipidemia: Secondary | ICD-10-CM | POA: Diagnosis not present

## 2017-10-24 DIAGNOSIS — Z888 Allergy status to other drugs, medicaments and biological substances status: Secondary | ICD-10-CM | POA: Diagnosis not present

## 2017-10-24 DIAGNOSIS — O8602 Infection of obstetric surgical wound, deep incisional site: Secondary | ICD-10-CM

## 2017-10-24 DIAGNOSIS — Z8249 Family history of ischemic heart disease and other diseases of the circulatory system: Secondary | ICD-10-CM | POA: Diagnosis not present

## 2017-10-24 DIAGNOSIS — Z79899 Other long term (current) drug therapy: Secondary | ICD-10-CM | POA: Diagnosis not present

## 2017-10-24 NOTE — Discharge Instructions (Signed)

## 2017-11-20 ENCOUNTER — Encounter: Payer: Self-pay | Admitting: Physician Assistant

## 2018-01-08 ENCOUNTER — Ambulatory Visit: Payer: Self-pay | Admitting: Surgery

## 2018-01-08 NOTE — H&P (View-Only) (Signed)
Lisa Powers Self Documented: 01/08/2018 9:54 AM Location: [facility] Patient #: [id number] DOB: 1991/06/14 Single / Language: [place] / Race: White Female  History of Present Illness Lisa Sportsman MD; 01/08/2018 10:24 AM) The patient is a 27 year old female who presents with a subcutaneous abscess. Note for "Subcutaneous abscess": ` ` ` Patient sent for surgical consultation at the request of Lisa Powers  Chief Complaint: Buttock abscess with recurrent infections  The patient is a pleasant smoking female that has struggled with a persistent recurrent infection on her left inner buttock near the top of her tailbone. She will get pain and swelling. She had to have it lanced. He does smoke but usually less than half a pack a day. Had to have it drained emergency room April 2018. Had another episode that required drainage in the office last month. Improved on oral antibiotics. Because of the persistent recurrent infection, surgical consultation requested to see if she would benefit from more definitive treatment. She comes today with her husband. She had a C-section in late February. She is breast feeding her newborn son. She is a Runner, broadcasting/film/video. No history of other infections. No history of MRSA. No cardiac or pulmonary issues.  (Review of systems as stated in this history (HPI) or in the review of systems. Otherwise all other 12 point ROS are negative) ` ` `   Allergies [name]; 01/08/2018 9:54 AM) No Known Drug Allergies [01/08/2018]: Allergies Reconciled  Medication History [name]; 01/08/2018 9:54 AM) Sertraline HCl (  Tablet, Oral) Active. Norethindrone (0.35MG  Tablet, Oral) Active. Oxycodone-Acetaminophen (5-325MG  Tablet, Oral) Active. Medications Reconciled    Vitals [name]; 01/08/2018 9:55 AM) 01/08/2018 9:54 AM Weight: 236.13 lb Height: 64in Body Surface Area: 2.1 m Body Mass Index: 40.53 kg/m  Temp.: 98.58F(Oral)   Pulse: 104 (Regular)  BP: 110/72 (Sitting, Left Arm, Standard)      Physical Exam Lisa Sportsman MD; 01/08/2018 10:25 AM)  General Mental Status-Alert. General Appearance-Not in acute distress, Not Sickly. Orientation-Oriented X3. Hydration-Well hydrated. Voice-Normal.  Integumentary Global Assessment Upon inspection and palpation of skin surfaces of the - Axillae: non-tender, no inflammation or ulceration, no drainage. and Distribution of scalp and body hair is normal. General Characteristics Temperature - normal warmth is noted.  Head and Neck Head-normocephalic, atraumatic with no lesions or palpable masses. Face Global Assessment - atraumatic, no absence of expression. Neck Global Assessment - no abnormal movements, no bruit auscultated on the right, no bruit auscultated on the left, no decreased range of motion, non-tender. Trachea-midline. Thyroid Gland Characteristics - non-tender.  Eye Eyeball - Left-Extraocular movements intact, No Nystagmus. Eyeball - Right-Extraocular movements intact, No Nystagmus. Cornea - Left-No Hazy. Cornea - Right-No Hazy. Sclera/Conjunctiva - Left-No scleral icterus, No Discharge. Sclera/Conjunctiva - Right-No scleral icterus, No Discharge. Pupil - Left-Direct reaction to light normal. Pupil - Right-Direct reaction to light normal.  ENMT Ears Pinna - Left - no drainage observed, no generalized tenderness observed. Right - no drainage observed, no generalized tenderness observed. Nose and Sinuses External Inspection of the Nose - no destructive lesion observed. Inspection of the nares - Left - quiet respiration. Right - quiet respiration. Mouth and Throat Lips - Upper Lip - no fissures observed, no pallor noted. Lower Lip - no fissures observed, no pallor noted. Nasopharynx - no discharge present. Oral Cavity/Oropharynx - Tongue - no dryness observed. Oral Mucosa - no cyanosis observed. Hypopharynx - no  evidence of airway distress observed.  Chest and Lung Exam Inspection Movements - Normal  and Symmetrical. Accessory muscles - No use of accessory muscles in breathing. Palpation Palpation of the chest reveals - Non-tender. Auscultation Breath sounds - Normal and Clear.  Cardiovascular Auscultation Rhythm - Regular. Murmurs & Other Heart Sounds - Auscultation of the heart reveals - No Murmurs and No Systolic Clicks.  Abdomen Inspection Inspection of the abdomen reveals - No Visible peristalsis and No Abnormal pulsations. Umbilicus - No Bleeding, No Urine drainage. Palpation/Percussion Palpation and Percussion of the abdomen reveal - Soft, Non Tender, No Rebound tenderness, No Rigidity (guarding) and No Cutaneous hyperesthesia. Note: Abdomen soft. Not severely distended. No distasis recti. No umbilical or other anterior abdominal wall hernias  Female Genitourinary Sexual Maturity Tanner 5 - Adult hair pattern. Note: No vaginal bleeding nor discharge. No inguinal hernias. Well-healed suprapubic Pfannenstiel incision  Rectal Note: ` ` ` Chronic scarring and upper left gluteus. 3x2cm region. Upper intergluteal cleft with 4 dimpled pits. Suspicious for pilonidal disease. No obvious ingrown hairs or active infection right now. Perianal region clear with no fistula, warts, fissure, abscess, or external hemorrhoids. Held off on digital exam.  Peripheral Vascular Upper Extremity Inspection - Left - No Cyanotic nailbeds, Not Ischemic. Right - No Cyanotic nailbeds, Not Ischemic.  Neurologic Neurologic evaluation reveals -normal attention span and ability to concentrate, able to name objects and repeat phrases. Appropriate fund of knowledge , normal sensation and normal coordination. Mental Status Affect - not angry, not paranoid. Cranial Nerves-Normal Bilaterally. Gait-Normal.  Neuropsychiatric Mental status exam performed with findings of-able to articulate well  with normal speech/language, rate, volume and coherence, thought content normal with ability to perform basic computations and apply abstract reasoning and no evidence of hallucinations, delusions, obsessions or homicidal/suicidal ideation.  Musculoskeletal Global Assessment Spine, Ribs and Pelvis - no instability, subluxation or laxity. Right Upper Extremity - no instability, subluxation or laxity.  Lymphatic Head & Neck  General Head & Neck Lymphatics: Bilateral - Description - No Localized lymphadenopathy. Axillary  General Axillary Region: Bilateral - Description - No Localized lymphadenopathy. Femoral & Inguinal  Generalized Femoral & Inguinal Lymphatics: Left - Description - No Localized lymphadenopathy. Right - Description - No Localized lymphadenopathy.    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  I recommend surgical excision since she's had 4 attacks. Would plan layer closure with wicks. Removed a superficial wick the first week, the deeper when the following week, follow up in 3 weeks.  She her husband are interested in proceeding.  I strongly recommend she quit smoking break the cycle of infections and prevent recurrence.  The anatomy of the gluteal and sacral region was discussed. Pathophysiology of pilonidal disease due to ingrown hairs was discussed. Importance of good hygiene discussed. Importance of keeping hairs trimmed in the intergluteal crease from anus to top of sacrum/tailbone spine noted. Need for good hygiene stressed. Use of electric razor or nose hair trimmers to keep the hairs trimmed discussed.  Risk of worsening progression needing surgical drainage of abscess or perhaps excision of chronic pilonidal disease with skin flap closure over drains discussed. Possible need for her to leave it open with packing to allow the wound  close over several weeks/months discussed. Risks benefits alternatives to surgery discussed as well. Risk of recurrent disease discussed. Patient was expressed understanding. Literature given to patient. We will see if we can control this without an operation first.  Current Plans You are being  scheduled for surgery- Our schedulers will call you.  You should hear from our office's scheduling department within 5 working days about the location, date, and time of surgery. We try to make accommodations for patient's preferences in scheduling surgery, but sometimes the OR schedule or the surgeon's schedule prevents [place] from making those accommodations.  If you have not heard from our office [phone]) in 5 working days, call the office and ask for your surgeon's nurse.  If you have other questions about your diagnosis, plan, or surgery, call the office and ask for your surgeon's nurse.  The anatomy of the gluteal and sacral region was discussed. Pathophysiology of pilonidal disease due to ingrown hairs was discussed. Importance of good hygiene discussed. Importance of keeping hairs trimmed in the intergluteal crease from anus to top of sacrum/tailbone spine noted. Need for good hygiene stressed. Use of electric razor or nose hair trimmers to keep the hairs trimmed discussed.  Risk of worsening progression needing surgical drainage of abscess or perhaps excision of chronic pilonidal disease with skin flap closure over drains discussed. Possible need for her to leave it open with packing to allow the wound close over several weeks/months discussed. Risks benefits alternatives to surgery discussed as well. Risk of recurrent disease discussed. Patient was expressed understanding. Literature given to patient. We will see if we can control this without an operation first.  Pt Education - [facility] Pilonidal Surgery [facility] - post op instructions: discussed with patient and provided information. The  anatomy of the intragluteal cleft was discussed. Pathophysiology of pilonidal disease was discussed. The importance of keeping hairs trimmed to avoid recurrence was discussed. Discussion of options such as curretage, excision with closure vs leaving open was discussed. Risks of infection with need for incision and drainage & antibiotics were discussed. I noted a good likelihood this will help address the problem.  At this point, I think the patient would best served with considering surgery to excise the diseased tissue. I will make an attempt to close but it may need to be left open to allow it to heal with secondary intention and wound packing. Possible recurrences need reoperation or different techniques were discussed as well. I noted that recurrence is higher with poor compliance on hair removal hygiene & overall health. The patient's questions were answered. The patient agrees to proceed.   TOBACCO ABUSE (Z72.0)  Current Plans Pt Education - [facility] STOP SMOKING! STOP SMOKING! We talked to the patient about the dangers of smoking.  We stressed that tobacco use dramatically increases the risk of peri-operative complications such as infection, tissue necrosis leaving to problems with incision/wound and organ healing, hernia, chronic pain, heart attack, stroke, DVT, pulmonary embolism, and death.  We noted there are programs in our community to help stop smoking.  Information was available.   Lisa Powers, M.D., F.A.C.S. Gastrointestinal and Minimally Invasive Surgery [facility] [address] [place], [place] [address] [phone] Main / Paging

## 2018-01-08 NOTE — H&P (Signed)
Rielyn M Lisa Powers: 01/08/2018 9:54 AM Location: [facility] Patient #: [id number] DOB: 10/24/1990 Single / Language: English / Race: White Female  History of Present Illness (Lisa Powers C. Jermaine Tholl MD; 01/08/2018 10:24 AM) The patient is a 27 year old female who presents with a subcutaneous abscess. Note for "Subcutaneous abscess": ` ` ` Patient sent for surgical consultation at the request of Lisa Powers  Chief Complaint: Buttock abscess with recurrent infections  The patient is a pleasant smoking female that has struggled with a persistent recurrent infection on her left inner buttock near the top of her tailbone. She will get pain and swelling. She had to have it lanced. He does smoke but usually less than half a pack a day. Had to have it drained emergency room April 2018. Had another episode that required drainage in the office last month. Improved on oral antibiotics. Because of the persistent recurrent infection, surgical consultation requested to see if she would benefit from more definitive treatment. She comes today with her husband. She had a C-section in late February. She is breast feeding her newborn son. 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 No history of MRSA. No cardiac or pulmonary issues.  (Review of systems as stated in this history (HPI) or in the review of systems. Otherwise all other 12 point ROS are negative) ` ` `   Allergies (Lisa Powers; 01/08/2018 9:54 AM) No Known Drug Allergies [01/08/2018]: Allergies Reconciled  Medication History (Lisa Powers; 01/08/2018 9:54 AM) Sertraline HCl (25MG Tablet, Oral) Active. Norethindrone (0.35MG Tablet, Oral) Active. Oxycodone-Acetaminophen (5-325MG Tablet, Oral) Active. Medications Reconciled    Vitals (Lisa Powers; 01/08/2018 9:55 AM) 01/08/2018 9:54 AM Weight: 236.13 lb Height: 64in Body Surface Area: 2.1 m Body Mass Index: 40.53 kg/m  Temp.: 98.3F(Oral)   Pulse: 104 (Regular)  BP: 110/72 (Sitting, Left Arm, Standard)      Physical Exam ([name] C. [name] MD; 01/08/2018 10:25 AM)  General Mental Status-Alert. General Appearance-Not in acute distress, Not Sickly. Orientation-Oriented X3. Hydration-Well hydrated. Voice-Normal.  Integumentary Global Assessment Upon inspection and palpation of skin surfaces of the - Axillae: non-tender, no inflammation or ulceration, no drainage. and Distribution of scalp and body hair is normal. General Characteristics Temperature - normal warmth is noted.  Head and Neck Head-normocephalic, atraumatic with no lesions or palpable masses. Face Global Assessment - atraumatic, no absence of expression. Neck Global Assessment - no abnormal movements, no bruit auscultated on the right, no bruit auscultated on the left, no decreased range of motion, non-tender. Trachea-midline. Thyroid Gland Characteristics - non-tender.  Eye Eyeball - Left-Extraocular movements intact, No Nystagmus. Eyeball - Right-Extraocular movements intact, No Nystagmus. Cornea - Left-No Hazy. Cornea - Right-No Hazy. Sclera/Conjunctiva - Left-No scleral icterus, No Discharge. Sclera/Conjunctiva - Right-No scleral icterus, No Discharge. Pupil - Left-Direct reaction to light normal. Pupil - Right-Direct reaction to light normal.  ENMT Ears Pinna - Left - no drainage observed, no generalized tenderness observed. Right - no drainage observed, no generalized tenderness observed. Nose and Sinuses External Inspection of the Nose - no destructive lesion observed. Inspection of the nares - Left - quiet respiration. Right - quiet respiration. Mouth and Throat Lips - Upper Lip - no fissures observed, no pallor noted. Lower Lip - no fissures observed, no pallor noted. Nasopharynx - no discharge present. Oral Cavity/Oropharynx - Tongue - no dryness observed. Oral Mucosa - no cyanosis observed. Hypopharynx - no  evidence of airway distress observed.  Chest and Lung Exam Inspection Movements - Normal   and Symmetrical. Accessory muscles - No use of accessory muscles in breathing. Palpation Palpation of the chest reveals - Non-tender. Auscultation Breath sounds - Normal and Clear.  Cardiovascular Auscultation Rhythm - Regular. Murmurs & Other Heart Sounds - Auscultation of the heart reveals - No Murmurs and No Systolic Clicks.  Abdomen Inspection Inspection of the abdomen reveals - No Visible peristalsis and No Abnormal pulsations. Umbilicus - No Bleeding, No Urine drainage. Palpation/Percussion Palpation and Percussion of the abdomen reveal - Soft, Non Tender, No Rebound tenderness, No Rigidity (guarding) and No Cutaneous hyperesthesia. Note: Abdomen soft. Not severely distended. No distasis recti. No umbilical or other anterior abdominal wall hernias  Female Genitourinary Sexual Maturity Tanner 5 - Adult hair pattern. Note: No vaginal bleeding nor discharge. No inguinal hernias. Well-healed suprapubic Pfannenstiel incision  Rectal Note: ` ` ` Chronic scarring and upper left gluteus. 3x2cm region. Upper intergluteal cleft with 4 dimpled pits. Suspicious for pilonidal disease. No obvious ingrown hairs or active infection right now. Perianal region clear with no fistula, warts, fissure, abscess, or external hemorrhoids. Held off on digital exam.  Peripheral Vascular Upper Extremity Inspection - Left - No Cyanotic nailbeds, Not Ischemic. Right - No Cyanotic nailbeds, Not Ischemic.  Neurologic Neurologic evaluation reveals -normal attention span and ability to concentrate, able to name objects and repeat phrases. Appropriate fund of knowledge , normal sensation and normal coordination. Mental Status Affect - not angry, not paranoid. Cranial Nerves-Normal Bilaterally. Gait-Normal.  Neuropsychiatric Mental status exam performed with findings of-able to articulate well  with normal speech/language, rate, volume and coherence, thought content normal with ability to perform basic computations and apply abstract reasoning and no evidence of hallucinations, delusions, obsessions or homicidal/suicidal ideation.  Musculoskeletal Global Assessment Spine, Ribs and Pelvis - no instability, subluxation or laxity. Right Upper Extremity - no instability, subluxation or laxity.  Lymphatic Head & Neck  General Head & Neck Lymphatics: Bilateral - Description - No Localized lymphadenopathy. Axillary  General Axillary Region: Bilateral - Description - No Localized lymphadenopathy. Femoral & Inguinal  Generalized Femoral & Inguinal Lymphatics: Left - Description - No Localized lymphadenopathy. Right - Description - No Localized lymphadenopathy.    Assessment & Plan (Mariely Mahr C. 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  I recommend surgical excision since she's had 4 attacks. Would plan layer closure with wicks. Removed a superficial wick the first week, the deeper when the following week, follow up in 3 weeks.  She her husband are interested in proceeding.  I strongly recommend she quit smoking break the cycle of infections and prevent recurrence.  The anatomy of the gluteal and sacral region was discussed. Pathophysiology of pilonidal disease due to ingrown hairs was discussed. Importance of good hygiene discussed. Importance of keeping hairs trimmed in the intergluteal crease from anus to top of sacrum/tailbone spine noted. Need for good hygiene stressed. Use of electric razor or nose hair trimmers to keep the hairs trimmed discussed.  Risk of worsening progression needing surgical drainage of abscess or perhaps excision of chronic pilonidal disease with skin flap closure over drains discussed. Possible need for her to leave it open with packing to allow the wound  close over several weeks/months discussed. Risks benefits alternatives to surgery discussed as well. Risk of recurrent disease discussed. Patient was expressed understanding. Literature given to patient. We will see if we can control this without an operation first.  Current Plans You are being   scheduled for surgery- Our schedulers will call you.  You should hear from our office's scheduling department within 5 working days about the location, date, and time of surgery. We try to make accommodations for patient's preferences in scheduling surgery, but sometimes the OR schedule or the surgeon's schedule prevents us from making those accommodations.  If you have not heard from our office ([phone]) in 5 working days, call the office and ask for your surgeon's nurse.  If you have other questions about your diagnosis, plan, or surgery, call the office and ask for your surgeon's nurse.  The anatomy of the gluteal and sacral region was discussed. Pathophysiology of pilonidal disease due to ingrown hairs was discussed. Importance of good hygiene discussed. Importance of keeping hairs trimmed in the intergluteal crease from anus to top of sacrum/tailbone spine noted. Need for good hygiene stressed. Use of electric razor or nose hair trimmers to keep the hairs trimmed discussed.  Risk of worsening progression needing surgical drainage of abscess or perhaps excision of chronic pilonidal disease with skin flap closure over drains discussed. Possible need for her to leave it open with packing to allow the wound close over several weeks/months discussed. Risks benefits alternatives to surgery discussed as well. Risk of recurrent disease discussed. Patient was expressed understanding. Literature given to patient. We will see if we can control this without an operation first.  Pt Education - [facility] Pilonidal [facility] - post op instructions: discussed with patient and provided information. The  anatomy of the intragluteal cleft was discussed. Pathophysiology of pilonidal disease was discussed. The importance of keeping hairs trimmed to avoid recurrence was discussed. Discussion of options such as curretage, excision with closure vs leaving open was discussed. Risks of infection with need for incision and drainage & antibiotics were discussed. I noted a good likelihood this will help address the problem.  At this point, I think the patient would best served with considering surgery to excise the diseased tissue. I will make an attempt to close but it may need to be left open to allow it to heal with secondary intention and wound packing. Possible recurrences need reoperation or different techniques were discussed as well. I noted that recurrence is higher with poor compliance on hair removal hygiene & overall health. The patient's questions were answered. The patient agrees to proceed.   TOBACCO ABUSE (Z72.0)  Current Plans Pt Education - [facility] STOP SMOKING! STOP SMOKING! We talked to the patient about the dangers of smoking.  We stressed that tobacco use dramatically increases the risk of peri-operative complications such as infection, tissue necrosis leaving to problems with incision/wound and organ healing, hernia, chronic pain, heart attack, stroke, DVT, pulmonary embolism, and death.  We noted there are programs in our community to help stop smoking.  Information was available.   Kemiah Booz C. Maheen Cwikla, M.D., F.A.C.S. Gastrointestinal and Minimally Invasive Surgery [facility] [address] [place], [place] [address] [phone] Main / Paging   

## 2018-01-23 ENCOUNTER — Other Ambulatory Visit: Payer: Self-pay

## 2018-01-23 ENCOUNTER — Encounter (HOSPITAL_BASED_OUTPATIENT_CLINIC_OR_DEPARTMENT_OTHER): Payer: Self-pay | Admitting: *Deleted

## 2018-01-23 NOTE — Progress Notes (Addendum)
Your procedure is scheduled on  ___06-12-2019_________  Report to [facility] AT  9:00 A. M._____   Call this number if you have problems the morning of surgery  :[phone].   OUR ADDRESS IS [address].  WE ARE LOCATED IN [facility].                                     REMEMBER: NO SOLID FOOD AFTER MIDNIGHT THE NIGHT PRIOR TO SURGERY. NOTHING BY MOUTH EXCEPT CLEAR LIQUIDS UNTIL 3 HOURS PRIOR TO SCHEULED SURGERY. PLEASE FINISH ENSURE DRINK PER SURGEON ORDER 3 HOURS PRIOR TO SCHEDULED SURGERY TIME WHICH NEEDS TO BE COMPLETED AT __8:00 AM_____. (SEE BELOW CLEAR LIQUID DIET)  TAKE THESE MEDICATIONS MORNING OF SURGERY WITH A SIP OF WATER:  _____NONE__________                                    DO NOT WEAR JEWERLY, MAKE UP, OR NAIL POLISH,  DO NOT WEAR LOTIONS, POWDERS, PERFUMES OR DEODORANT. DO NOT SHAVE FOR 24 HOURS PRIOR TO DAY OF SURGERY.  CONTACTS, GLASSES, OR DENTURES MAY NOT BE WORN TO SURGERY.                                    [facility] IS NOT RESPONSIBLE  FOR ANY BELONGINGS.                                   CLEAR LIQUID DIET   Foods Allowed                                                                     Foods Excluded  Coffee and tea, regular and decaf                             liquids that you cannot  Plain Jell-O in any flavor                                             see through such as: Fruit ices (not with fruit pulp)                                     milk, soups, orange juice  Iced Popsicles                                    All solid food Carbonated beverages, regular and diet  Cranberry, grape and apple juices Sports drinks like Gatorade Lightly seasoned clear broth or consume(fat free) Sugar, honey syrup  Sample Menu Breakfast                                Lunch                                     Supper Cranberry juice                    Beef broth                             Chicken broth Jell-O                                     Grape juice                           Apple juice Coffee or tea                        Jell-O                                      Popsicle                                                Coffee or tea                        Coffee or tea  _____________________________________________________________________  [facility] - Preparing for Surgery Before surgery, you can play an important role.  Because skin is not sterile, your skin needs to be as free of germs as possible.  You can reduce the number of germs on your skin by washing with CHG (chlorahexidine gluconate) soap before surgery.  CHG is an antiseptic cleaner which kills germs and bonds with the skin to continue killing germs even after washing. Please DO NOT use if you have an allergy to CHG or antibacterial soaps.  If your skin becomes reddened/irritated stop using the CHG and inform your nurse when you arrive at Short Stay. Do not shave (including legs and underarms) for at least 48 hours prior to the first CHG shower.  You may shave your face/neck. Please follow these instructions carefully:  1.  Shower with CHG Soap the night before surgery and the  morning of Surgery.  2.  If you choose to wash your hair, wash your hair first as usual with your  normal  shampoo.  3.  After you shampoo, rinse your hair and body thoroughly to remove the  shampoo.                           4.  Use CHG as you would any other liquid soap.  You can apply chg directly  to the skin and wash  Gently with a scrungie or clean washcloth.  5.  Apply the CHG Soap to your body ONLY FROM THE NECK DOWN.   Do not use on face/ open                           Wound or open sores. Avoid contact with eyes, ears mouth and genitals (private parts).                       Wash face,  Genitals (private parts) with your normal soap.             6.  Wash thoroughly, paying special attention to the  area where your surgery  will be performed.  7.  Thoroughly rinse your body with warm water from the neck down.  8.  DO NOT shower/wash with your normal soap after using and rinsing off  the CHG Soap.                9.  Pat yourself dry with a clean towel.            10.  Wear clean pajamas.            11.  Place clean sheets on your bed the night of your first shower and do not  sleep with pets. Day of Surgery : Do not apply any lotions/deodorants the morning of surgery.  Please wear clean clothes to the hospital/surgery center.                                .Marland Kitchen

## 2018-01-23 NOTE — Progress Notes (Signed)
Spoke w/ pt via phone for pre-op interview.  Npo after mn w/ exception clear liquid until 0800 at which time will finish ensure pre-surgery drink .  Will do hibiclens shower hs before and am dos.  Pt father, fred self , to pick-up drink and hibiclens at [facility] prior to surgery date between 0830 to 1530. Prints instructions for pt , placed w/ drink and hibiclens.

## 2018-01-30 ENCOUNTER — Encounter (HOSPITAL_BASED_OUTPATIENT_CLINIC_OR_DEPARTMENT_OTHER)
Admission: RE | Disposition: A | Discharge status: Home / Self Care | Payer: Self-pay | Source: Ambulatory Visit | Attending: Surgery

## 2018-01-30 ENCOUNTER — Ambulatory Visit (HOSPITAL_BASED_OUTPATIENT_CLINIC_OR_DEPARTMENT_OTHER): Payer: BC Managed Care – PPO | Admitting: Certified Registered"

## 2018-01-30 ENCOUNTER — Other Ambulatory Visit: Payer: Self-pay

## 2018-01-30 ENCOUNTER — Ambulatory Visit (HOSPITAL_BASED_OUTPATIENT_CLINIC_OR_DEPARTMENT_OTHER)
Admission: RE | Admit: 2018-01-30 | Discharge: 2018-01-30 | Disposition: A | Discharge status: Home / Self Care | Payer: BC Managed Care – PPO | Source: Ambulatory Visit | Attending: Surgery | Admitting: Surgery

## 2018-01-30 ENCOUNTER — Encounter (HOSPITAL_BASED_OUTPATIENT_CLINIC_OR_DEPARTMENT_OTHER): Payer: Self-pay | Admitting: *Deleted

## 2018-01-30 DIAGNOSIS — Z79899 Other long term (current) drug therapy: Secondary | ICD-10-CM | POA: Diagnosis not present

## 2018-01-30 DIAGNOSIS — L988 Other specified disorders of the skin and subcutaneous tissue: Secondary | ICD-10-CM

## 2018-01-30 DIAGNOSIS — Z6841 Body Mass Index (BMI) 40.0 and over, adult: Secondary | ICD-10-CM | POA: Diagnosis not present

## 2018-01-30 DIAGNOSIS — K219 Gastro-esophageal reflux disease without esophagitis: Secondary | ICD-10-CM | POA: Insufficient documentation

## 2018-01-30 DIAGNOSIS — L0501 Pilonidal cyst with abscess: Secondary | ICD-10-CM | POA: Diagnosis not present

## 2018-01-30 DIAGNOSIS — I10 Essential (primary) hypertension: Secondary | ICD-10-CM | POA: Diagnosis not present

## 2018-01-30 DIAGNOSIS — J45909 Unspecified asthma, uncomplicated: Secondary | ICD-10-CM | POA: Insufficient documentation

## 2018-01-30 DIAGNOSIS — Z789 Other specified health status: Secondary | ICD-10-CM | POA: Diagnosis present

## 2018-01-30 DIAGNOSIS — F17210 Nicotine dependence, cigarettes, uncomplicated: Secondary | ICD-10-CM | POA: Diagnosis present

## 2018-01-30 DIAGNOSIS — L0591 Pilonidal cyst without abscess: Secondary | ICD-10-CM | POA: Diagnosis present

## 2018-01-30 HISTORY — DX: Gastro-esophageal reflux disease without esophagitis: K21.9

## 2018-01-30 HISTORY — DX: Presence of spectacles and contact lenses: Z97.3

## 2018-01-30 HISTORY — PX: PILONIDAL CYST EXCISION: SHX744

## 2018-01-30 HISTORY — DX: Other asthma: J45.998

## 2018-01-30 HISTORY — DX: Personal history of other complications of pregnancy, childbirth and the puerperium: Z87.59

## 2018-01-30 HISTORY — DX: Other specified disorders of the skin and subcutaneous tissue: L98.8

## 2018-01-30 LAB — POCT PREGNANCY, URINE
PREG TEST UR: NEGATIVE
Preg Test, Ur: NEGATIVE

## 2018-01-30 SURGERY — EXCISION, PILONIDAL CYST, EXTENSIVE
Anesthesia: General | Site: Buttocks

## 2018-01-30 MED ORDER — GABAPENTIN 300 MG PO CAPS
ORAL_CAPSULE | ORAL | Status: AC
  Filled 2018-01-30: qty 1

## 2018-01-30 MED ORDER — LIDOCAINE 2% (20 MG/ML) 5 ML SYRINGE
INTRAMUSCULAR | Status: DC | PRN
  Administered 2018-01-30: 1.5 mg/kg/h via INTRAVENOUS

## 2018-01-30 MED ORDER — BUPIVACAINE LIPOSOME 1.3 % IJ SUSP
INTRAMUSCULAR | Status: DC | PRN
  Administered 2018-01-30: 20 mL

## 2018-01-30 MED ORDER — DEXAMETHASONE SODIUM PHOSPHATE 10 MG/ML IJ SOLN
INTRAMUSCULAR | Status: AC
  Filled 2018-01-30: qty 1

## 2018-01-30 MED ORDER — KETOROLAC TROMETHAMINE 30 MG/ML IJ SOLN
INTRAMUSCULAR | Status: AC
  Filled 2018-01-30: qty 1

## 2018-01-30 MED ORDER — SCOPOLAMINE 1 MG/3DAYS TD PT72
MEDICATED_PATCH | TRANSDERMAL | Status: AC
  Filled 2018-01-30: qty 1

## 2018-01-30 MED ORDER — ACETAMINOPHEN 500 MG PO TABS
ORAL_TABLET | ORAL | Status: AC
  Filled 2018-01-30: qty 2

## 2018-01-30 MED ORDER — ACETAMINOPHEN 325 MG PO TABS
650.0000 mg | ORAL_TABLET | ORAL | Status: DC | PRN
  Filled 2018-01-30: qty 2

## 2018-01-30 MED ORDER — SODIUM CHLORIDE 0.9% FLUSH
3.0000 mL | INTRAVENOUS | Status: DC | PRN
  Filled 2018-01-30: qty 3

## 2018-01-30 MED ORDER — FENTANYL CITRATE (PF) 100 MCG/2ML IJ SOLN
INTRAMUSCULAR | Status: AC
  Filled 2018-01-30: qty 2

## 2018-01-30 MED ORDER — GABAPENTIN 300 MG PO CAPS
300.0000 mg | ORAL_CAPSULE | ORAL | Status: AC
  Administered 2018-01-30: 300 mg via ORAL
  Filled 2018-01-30: qty 1

## 2018-01-30 MED ORDER — ACETAMINOPHEN 500 MG PO TABS
1000.0000 mg | ORAL_TABLET | ORAL | Status: AC
  Administered 2018-01-30: 1000 mg via ORAL
  Filled 2018-01-30: qty 2

## 2018-01-30 MED ORDER — HYDROMORPHONE HCL 1 MG/ML IJ SOLN
0.2500 mg | INTRAMUSCULAR | Status: DC | PRN
  Administered 2018-01-30: 0.5 mg via INTRAVENOUS
  Filled 2018-01-30: qty 0.5

## 2018-01-30 MED ORDER — CELECOXIB 200 MG PO CAPS
ORAL_CAPSULE | ORAL | Status: AC
  Filled 2018-01-30: qty 1

## 2018-01-30 MED ORDER — FENTANYL CITRATE (PF) 100 MCG/2ML IJ SOLN
INTRAMUSCULAR | Status: DC | PRN
  Administered 2018-01-30 (×4): 50 ug via INTRAVENOUS

## 2018-01-30 MED ORDER — OXYCODONE HCL 5 MG PO TABS: 5.0000 mg | ORAL_TABLET | Freq: Four times a day (QID) | ORAL | 0 refills | Status: DC | PRN

## 2018-01-30 MED ORDER — CEFAZOLIN SODIUM-DEXTROSE 2-3 GM-%(50ML) IV SOLR
INTRAVENOUS | Status: DC | PRN
  Administered 2018-01-30: 2 g via INTRAVENOUS

## 2018-01-30 MED ORDER — MIDAZOLAM HCL 2 MG/2ML IJ SOLN
INTRAMUSCULAR | Status: AC
  Filled 2018-01-30: qty 2

## 2018-01-30 MED ORDER — SCOPOLAMINE 1 MG/3DAYS TD PT72
MEDICATED_PATCH | TRANSDERMAL | Status: DC | PRN
  Administered 2018-01-30: 1 via TRANSDERMAL

## 2018-01-30 MED ORDER — KETAMINE HCL 100 MG/ML IJ SOLN
INTRAMUSCULAR | Status: DC | PRN
  Administered 2018-01-30: 25 mg via INTRAVENOUS

## 2018-01-30 MED ORDER — OXYCODONE HCL 5 MG PO TABS
5.0000 mg | ORAL_TABLET | Freq: Once | ORAL | Status: AC | PRN
  Administered 2018-01-30: 5 mg via ORAL
  Filled 2018-01-30: qty 1

## 2018-01-30 MED ORDER — HYDROMORPHONE HCL 1 MG/ML IJ SOLN
INTRAMUSCULAR | Status: AC
  Filled 2018-01-30: qty 1

## 2018-01-30 MED ORDER — LIDOCAINE 2% (20 MG/ML) 5 ML SYRINGE
INTRAMUSCULAR | Status: AC
  Filled 2018-01-30: qty 5

## 2018-01-30 MED ORDER — OXYCODONE HCL 5 MG/5ML PO SOLN
5.0000 mg | Freq: Once | ORAL | Status: AC | PRN
  Filled 2018-01-30: qty 5

## 2018-01-30 MED ORDER — PROMETHAZINE HCL 25 MG/ML IJ SOLN
6.2500 mg | INTRAMUSCULAR | Status: DC | PRN
  Filled 2018-01-30: qty 1

## 2018-01-30 MED ORDER — OXYCODONE HCL 5 MG PO TABS
ORAL_TABLET | ORAL | Status: AC
  Filled 2018-01-30: qty 1

## 2018-01-30 MED ORDER — PROPOFOL 10 MG/ML IV BOLUS
INTRAVENOUS | Status: AC
  Filled 2018-01-30: qty 20

## 2018-01-30 MED ORDER — ROCURONIUM BROMIDE 10 MG/ML (PF) SYRINGE
PREFILLED_SYRINGE | INTRAVENOUS | Status: AC
  Filled 2018-01-30: qty 5

## 2018-01-30 MED ORDER — ROCURONIUM BROMIDE 100 MG/10ML IV SOLN
INTRAVENOUS | Status: DC | PRN
  Administered 2018-01-30: 10 mg via INTRAVENOUS
  Administered 2018-01-30: 50 mg via INTRAVENOUS

## 2018-01-30 MED ORDER — FENTANYL CITRATE (PF) 100 MCG/2ML IJ SOLN
25.0000 ug | INTRAMUSCULAR | Status: DC | PRN
  Filled 2018-01-30: qty 1

## 2018-01-30 MED ORDER — CEFAZOLIN SODIUM 1 G IJ SOLR
INTRAMUSCULAR | Status: AC
  Filled 2018-01-30: qty 20

## 2018-01-30 MED ORDER — SUGAMMADEX SODIUM 200 MG/2ML IV SOLN
INTRAVENOUS | Status: DC | PRN
  Administered 2018-01-30: 200 mg via INTRAVENOUS

## 2018-01-30 MED ORDER — KETAMINE HCL 10 MG/ML IJ SOLN
INTRAMUSCULAR | Status: AC
  Filled 2018-01-30: qty 1

## 2018-01-30 MED ORDER — CELECOXIB 200 MG PO CAPS
200.0000 mg | ORAL_CAPSULE | ORAL | Status: AC
  Administered 2018-01-30: 200 mg via ORAL
  Filled 2018-01-30: qty 1

## 2018-01-30 MED ORDER — ENSURE PRE-SURGERY PO LIQD
296.0000 mL | Freq: Once | ORAL | Status: AC
  Administered 2018-01-30: 296 mL via ORAL
  Filled 2018-01-30: qty 296

## 2018-01-30 MED ORDER — ONDANSETRON HCL 4 MG/2ML IJ SOLN
INTRAMUSCULAR | Status: AC
  Filled 2018-01-30: qty 4

## 2018-01-30 MED ORDER — MIDAZOLAM HCL 5 MG/5ML IJ SOLN
INTRAMUSCULAR | Status: DC | PRN
  Administered 2018-01-30: 2 mg via INTRAVENOUS

## 2018-01-30 MED ORDER — LIDOCAINE 2% (20 MG/ML) 5 ML SYRINGE
INTRAMUSCULAR | Status: DC | PRN
  Administered 2018-01-30: 100 mg via INTRAVENOUS

## 2018-01-30 MED ORDER — PROPOFOL 10 MG/ML IV BOLUS
INTRAVENOUS | Status: DC | PRN
  Administered 2018-01-30: 150 mg via INTRAVENOUS
  Administered 2018-01-30: 50 mg via INTRAVENOUS

## 2018-01-30 MED ORDER — ONDANSETRON HCL 4 MG/2ML IJ SOLN
INTRAMUSCULAR | Status: DC | PRN
  Administered 2018-01-30 (×2): 4 mg via INTRAVENOUS

## 2018-01-30 MED ORDER — OXYCODONE HCL 5 MG PO TABS
5.0000 mg | ORAL_TABLET | ORAL | Status: DC | PRN
  Filled 2018-01-30: qty 2

## 2018-01-30 MED ORDER — SODIUM CHLORIDE 0.9% FLUSH
3.0000 mL | Freq: Two times a day (BID) | INTRAVENOUS | Status: DC
  Filled 2018-01-30: qty 3

## 2018-01-30 MED ORDER — SODIUM CHLORIDE 0.9 % IV SOLN
250.0000 mL | INTRAVENOUS | Status: DC | PRN
  Filled 2018-01-30: qty 250

## 2018-01-30 MED ORDER — MEPERIDINE HCL 25 MG/ML IJ SOLN
6.2500 mg | INTRAMUSCULAR | Status: DC | PRN
  Filled 2018-01-30: qty 1

## 2018-01-30 MED ORDER — DEXAMETHASONE SODIUM PHOSPHATE 10 MG/ML IJ SOLN
INTRAMUSCULAR | Status: DC | PRN
  Administered 2018-01-30: 10 mg via INTRAVENOUS

## 2018-01-30 MED ORDER — SUGAMMADEX SODIUM 200 MG/2ML IV SOLN
INTRAVENOUS | Status: AC
  Filled 2018-01-30: qty 2

## 2018-01-30 MED ORDER — BUPIVACAINE-EPINEPHRINE 0.25% -1:200000 IJ SOLN
INTRAMUSCULAR | Status: DC | PRN
  Administered 2018-01-30: 60 mL

## 2018-01-30 MED ORDER — LACTATED RINGERS IV SOLN
INTRAVENOUS | Status: DC
  Administered 2018-01-30 (×2): via INTRAVENOUS
  Filled 2018-01-30: qty 1000

## 2018-01-30 MED ORDER — ACETAMINOPHEN 650 MG RE SUPP
650.0000 mg | RECTAL | Status: DC | PRN
  Filled 2018-01-30: qty 1

## 2018-01-30 MED ORDER — NAPROXEN 500 MG PO TABS: 500.0000 mg | ORAL_TABLET | Freq: Two times a day (BID) | ORAL | 1 refills | Status: DC

## 2018-01-30 SURGICAL SUPPLY — 62 items
ADH SKN CLS APL DERMABOND .7 (GAUZE/BANDAGES/DRESSINGS) ×1
APL SKNCLS STERI-STRIP NONHPOA (GAUZE/BANDAGES/DRESSINGS) ×1
BENZOIN TINCTURE PRP APPL 2/3 (GAUZE/BANDAGES/DRESSINGS) ×2 IMPLANT
BLADE CLIPPER SURG (BLADE) ×1 IMPLANT
BLADE HEX COATED 2.75 (ELECTRODE) ×3 IMPLANT
BLADE SURG 10 STRL SS (BLADE) ×3 IMPLANT
BLADE SURG 15 STRL LF DISP TIS (BLADE) ×1 IMPLANT
BLADE SURG 15 STRL SS (BLADE) ×3
BRIEF STRETCH FOR OB PAD LRG (UNDERPADS AND DIAPERS) ×3 IMPLANT
CANISTER SUCT 3000ML PPV (MISCELLANEOUS) IMPLANT
CANISTER SUCTION 1200CC (MISCELLANEOUS) ×2 IMPLANT
CHLORAPREP W/TINT 26ML (MISCELLANEOUS) IMPLANT
CLEANER CAUTERY TIP 5X5 PAD (MISCELLANEOUS) ×1 IMPLANT
CLOSURE WOUND 1/2 X4 (GAUZE/BANDAGES/DRESSINGS)
COVER BACK TABLE 60X90IN (DRAPES) ×3 IMPLANT
COVER MAYO STAND STRL (DRAPES) ×3 IMPLANT
DERMABOND ADVANCED (GAUZE/BANDAGES/DRESSINGS) ×2
DERMABOND ADVANCED .7 DNX12 (GAUZE/BANDAGES/DRESSINGS) IMPLANT
DRAPE LAPAROTOMY 100X72 PEDS (DRAPES) ×3 IMPLANT
DRAPE UTILITY XL STRL (DRAPES) ×3 IMPLANT
DRSG PAD ABDOMINAL 8X10 ST (GAUZE/BANDAGES/DRESSINGS) IMPLANT
DRSG TEGADERM 4X4.75 (GAUZE/BANDAGES/DRESSINGS) ×4 IMPLANT
ELECT REM PT RETURN 9FT ADLT (ELECTROSURGICAL) ×3
ELECTRODE REM PT RTRN 9FT ADLT (ELECTROSURGICAL) ×1 IMPLANT
GAUZE PACKING 1/4 X5 YD (GAUZE/BANDAGES/DRESSINGS) ×2 IMPLANT
GAUZE PACKING 1/4X5YD (GAUZE/BANDAGES/DRESSINGS) ×2 IMPLANT
GAUZE PACKING IODOFORM 1/2 (PACKING) IMPLANT
GAUZE PACKING IODOFORM 1/4X15 (GAUZE/BANDAGES/DRESSINGS) IMPLANT
GAUZE SPONGE 4X4 12PLY STRL (GAUZE/BANDAGES/DRESSINGS) ×2 IMPLANT
GLOVE ECLIPSE 8.0 STRL XLNG CF (GLOVE) ×3 IMPLANT
GLOVE INDICATOR 8.0 STRL GRN (GLOVE) ×3 IMPLANT
GOWN STRL REUS W/ TWL XL LVL3 (GOWN DISPOSABLE) ×1 IMPLANT
GOWN STRL REUS W/TWL XL LVL3 (GOWN DISPOSABLE) ×3
KIT TURNOVER CYSTO (KITS) ×3 IMPLANT
NEEDLE HYPO 22GX1.5 SAFETY (NEEDLE) ×2 IMPLANT
NS IRRIG 500ML POUR BTL (IV SOLUTION) ×3 IMPLANT
PACK BASIN DAY SURGERY FS (CUSTOM PROCEDURE TRAY) ×3 IMPLANT
PAD ABD 8X10 STRL (GAUZE/BANDAGES/DRESSINGS) ×3 IMPLANT
PAD CLEANER CAUTERY TIP 5X5 (MISCELLANEOUS) ×2
PENCIL BUTTON HOLSTER BLD 10FT (ELECTRODE) ×3 IMPLANT
SPONGE GAUZE 2X2 8PLY STER LF (GAUZE/BANDAGES/DRESSINGS)
SPONGE GAUZE 2X2 8PLY STRL LF (GAUZE/BANDAGES/DRESSINGS) IMPLANT
SPONGE LAP 18X18 X RAY DECT (DISPOSABLE) ×3 IMPLANT
STRIP CLOSURE SKIN 1/2X4 (GAUZE/BANDAGES/DRESSINGS) IMPLANT
SUT MNCRL AB 3-0 PS2 18 (SUTURE) ×2 IMPLANT
SUT MNCRL AB 4-0 PS2 18 (SUTURE) ×2 IMPLANT
SUT SILK 2 0 SH (SUTURE) ×2 IMPLANT
SUT VIC AB 2-0 SH 18 (SUTURE) ×7 IMPLANT
SUT VIC AB 2-0 SH 27 (SUTURE)
SUT VIC AB 2-0 SH 27XBRD (SUTURE) IMPLANT
SUT VIC AB 3-0 SH 18 (SUTURE) ×4 IMPLANT
SUT VIC AB 3-0 SH 27 (SUTURE)
SUT VIC AB 3-0 SH 27X BRD (SUTURE) IMPLANT
SYR 20CC LL (SYRINGE) ×3 IMPLANT
SYR BULB IRRIGATION 50ML (SYRINGE) ×3 IMPLANT
SYR CONTROL 10ML LL (SYRINGE) ×2 IMPLANT
TOWEL OR 17X24 6PK STRL BLUE (TOWEL DISPOSABLE) ×5 IMPLANT
TRAY DSU PREP LF (CUSTOM PROCEDURE TRAY) ×2 IMPLANT
TUBE CONNECTING 12'X1/4 (SUCTIONS) ×1
TUBE CONNECTING 12X1/4 (SUCTIONS) ×2 IMPLANT
WATER STERILE IRR 500ML POUR (IV SOLUTION) IMPLANT
YANKAUER SUCT BULB TIP NO VENT (SUCTIONS) ×3 IMPLANT

## 2018-01-30 NOTE — Anesthesia Procedure Notes (Signed)
Procedure Name: Intubation Date/Time: 01/30/2018 11:16 AM Performed by: Marny Lowensteinapozzi, Graden Hoshino W, CRNA Pre-anesthesia Checklist: Patient identified, Emergency Drugs available, Suction available, Patient being monitored and Timeout performed Patient Re-evaluated:Patient Re-evaluated prior to induction Oxygen Delivery Method: Circle system utilized Preoxygenation: Pre-oxygenation with 100% oxygen Induction Type: IV induction Ventilation: Mask ventilation without difficulty Laryngoscope Size: Miller and 2 Grade View: Grade I Tube type: Oral Tube size: 7.0 mm Number of attempts: 1 Placement Confirmation: ETT inserted through vocal cords under direct vision,  positive ETCO2,  CO2 detector and breath sounds checked- equal and bilateral Secured at: 20 cm Tube secured with: Tape Dental Injury: Teeth and Oropharynx as per pre-operative assessment

## 2018-01-30 NOTE — Anesthesia Preprocedure Evaluation (Signed)
Anesthesia Evaluation  Patient identified by MRN, date of birth, ID band Patient awake    Reviewed: Allergy & Precautions, Patient's Chart, lab work & pertinent test results  Airway Mallampati: III  TM Distance: >3 FB Neck ROM: Full    Dental no notable dental hx.    Pulmonary asthma , Current Smoker,    Pulmonary exam normal breath sounds clear to auscultation       Cardiovascular Hypertension: PIH. Normal cardiovascular exam Rhythm:Regular Rate:Normal     Neuro/Psych negative neurological ROS     GI/Hepatic negative GI ROS, Neg liver ROS, GERD  ,  Endo/Other  Morbid obesity  Renal/GU negative Renal ROS     Musculoskeletal   Abdominal   Peds  Hematology negative hematology ROS (+)   Anesthesia Other Findings   Reproductive/Obstetrics                             Lab Results  Component Value Date   WBC 14.3 (H) 10/17/2017   HGB 9.1 (L) 10/17/2017   HCT 26.4 (L) 10/17/2017   MCV 90.4 10/17/2017   PLT 136 (L) 10/17/2017   Lab Results  Component Value Date   CREATININE 1.01 (H) 10/15/2017   BUN 6 10/15/2017   NA 133 (L) 10/15/2017   K 4.1 10/15/2017   CL 102 10/15/2017   CO2 21 (L) 10/15/2017    Anesthesia Physical  Anesthesia Plan  ASA: III  Anesthesia Plan: General   Post-op Pain Management:    Induction: Intravenous  PONV Risk Score and Plan: 2 and Ondansetron and Midazolam  Airway Management Planned: Oral ETT  Additional Equipment:   Intra-op Plan:   Post-operative Plan: Extubation in OR  Informed Consent: I have reviewed the patients History and Physical, chart, labs and discussed the procedure including the risks, benefits and alternatives for the proposed anesthesia with the patient or authorized representative who has indicated his/her understanding and acceptance.   Dental advisory given  Plan Discussed with: CRNA  Anesthesia Plan Comments:          Anesthesia Quick Evaluation

## 2018-01-30 NOTE — Interval H&P Note (Signed)
History and Physical Interval Note:  01/30/2018 10:33 AM  Lisa Powers  has presented today for surgery, with the diagnosis of Pilonidal disease with recurrent infection  The various methods of treatment have been discussed with the patient and family. After consideration of risks, benefits and other options for treatment, the patient has consented to  Procedure(s): EXCISION PILONIDAL DISEASE EXTENSIVE ERAS PATHWAY (N/A) as a surgical intervention .    I have re-reviewed the the patient's records, history, medications, and allergies.  I have re-examined the patient.  I again discussed intraoperative plans and goals of post-operative recovery.  The patient agrees to proceed.  Lisa Powers  01/25/1991 [id number]  Patient Care Team: Patient, No Pcp Per as PCP - General (General Practice) Karie SodaGross, Lakea Mittelman, MD as Consulting Physician (General Surgery) Truman HaywardStarkes, Takia S, FNP as Nurse Practitioner (Nurse Practitioner)  Patient Active Problem List   Diagnosis Date Noted  . Pregnancy 10/15/2017    Past Medical History:  Diagnosis Date  . GERD (gastroesophageal reflux disease)   . History of gestational hypertension   . Pilonidal disease    W/ RECURRENT INFECTION  . Seasonal asthma   . Wears contact lenses     Past Surgical History:  Procedure Laterality Date  . CESAREAN SECTION N/A 10/16/2017   Procedure: CESAREAN SECTION;  Surgeon: Harold Hedgeomblin, James, MD;  [facility];  Service: Obstetrics;  Laterality: N/A;    Social History   Socioeconomic History  . Marital status: Single    Spouse name: Not on file  . Number of children: Not on file  . Years of education: Not on file  . Highest education level: Not on file  Occupational History  . Not on file  Social Needs  . Financial resource strain: Not on file  . Food insecurity:    Worry: Not on file    Inability: Not on file  . Transportation needs:    Medical: Not on file    Non-medical: Not on file  Tobacco Use  .  Smoking status: Current Every Day Smoker    Packs/day: 0.25    Types: Cigarettes  . Smokeless tobacco: Never Used  . Tobacco comment: 01-23-2018  per pt was at 1ppd prior to preg. now 1pp3d  Substance and Sexual Activity  . Alcohol use: No  . Drug use: No  . Sexual activity: Not on file  Lifestyle  . Physical activity:    Days per week: Not on file    Minutes per session: Not on file  . Stress: Not on file  Relationships  . Social connections:    Talks on phone: Not on file    Gets together: Not on file    Attends religious service: Not on file    Active member of club or organization: Not on file    Attends meetings of clubs or organizations: Not on file    Relationship status: Not on file  . Intimate partner violence:    Fear of current or ex partner: Not on file    Emotionally abused: Not on file    Physically abused: Not on file    Forced sexual activity: Not on file  Other Topics Concern  . Not on file  Social History Narrative  . Not on file    Family History  Problem Relation Age of Onset  . Hyperlipidemia Mother   . Hyperlipidemia Father   . Heart disease Maternal Grandmother     Medications Prior to Admission  Medication Sig  Dispense Refill Last Dose  . albuterol (PROVENTIL HFA;VENTOLIN HFA) 108 (90 Base) MCG/ACT inhaler Inhale 1-2 puffs into the lungs every 6 (six) hours as needed for wheezing or shortness of breath. 1 Inhaler 0 Past Month at Unknown time  . Fenugreek 500 MG CAPS Take 2 capsules by mouth 3 (three) times daily.     . Norethin Ace-Eth Estrad-FE 1-20 MG-MCG(24) CAPS Take by mouth at bedtime. Brand name-- MYLAN     . Prenatal Vit-Fe Fumarate-FA (PRENATAL MULTIVITAMIN) TABS tablet Take 1 tablet by mouth daily at 12 noon.   10/22/2017 at Unknown time    Current Facility-Administered Medications  Medication Dose Route Frequency Provider Last Rate Last Dose  . acetaminophen (TYLENOL) tablet 1,000 mg  1,000 mg Oral On Call to OR Karie Soda, MD        . celecoxib (CELEBREX) capsule 200 mg  200 mg Oral On Call to OR Karie Soda, MD      . Melene Muller ON 01/31/2018] feeding supplement (ENSURE PRE-SURGERY) liquid 296 mL  296 mL Oral Once Karie Soda, MD      . gabapentin (NEURONTIN) capsule 300 mg  300 mg Oral On Call to OR Karie Soda, MD      . lactated ringers infusion   Intravenous Continuous Shelton Silvas, MD         Allergies  Allergen Reactions  . Claritin [Loratadine] Rash    BP (!) 143/78   Pulse 69   Temp 97.6 F (36.4 C) (Oral)   Resp 18   Ht 5\' 4"  (1.626 m)   Wt 108.5 kg (239 lb 1.6 oz)   SpO2 100%   Breastfeeding? Yes   BMI 41.04 kg/m   Labs: No results found for this or any previous visit (from the past 48 hour(s)).  Imaging / Studies: No results found.   [name], M.D., F.A.C.S. Gastrointestinal and Minimally Invasive Surgery [facility] [address], [address] [place], [place] [address] [phone] Main / Paging  01/30/2018 10:38 AM  The patient's history has been reviewed, patient examined, no change in status, stable for surgery.  I have reviewed the patient's chart and labs.  Questions were answered to the patient's satisfaction.     Lorenso Courier, MD, FACS, MASCRS Gastrointestinal and Minimally Invasive Surgery    [address], [address] [place], [place] [address] [phone] Main / Paging [phone] Fax

## 2018-01-30 NOTE — Discharge Instructions (Signed)
GENERAL SURGERY: POST OP INSTRUCTIONS  ######################################################################  EAT Gradually transition to a high fiber diet with a fiber supplement over the next few weeks after discharge.  Start with a pureed / full liquid diet (see below)  WALK Walk an hour a day.  Control your pain to do that.    CONTROL PAIN Control pain so that you can walk, sleep, tolerate sneezing/coughing, go up/down stairs.  HAVE A BOWEL MOVEMENT DAILY Keep your bowels regular to avoid problems.  OK to try a laxative to override constipation.  OK to use an antidairrheal to slow down diarrhea.  Call if not better after 2 tries  CALL IF YOU HAVE PROBLEMS/CONCERNS Call if you are still struggling despite following these instructions. Call if you have concerns not answered by these instructions  ######################################################################    1. DIET: Follow a light bland diet the first 24 hours after arrival home, such as soup, liquids, crackers, etc.  Be sure to include lots of fluids daily.  Avoid fast food or heavy meals as your are more likely to get nauseated.   2. Take your usually prescribed home medications unless otherwise directed. 3. PAIN CONTROL: a. Pain is best controlled by a usual combination of three different methods TOGETHER: i. Ice/Heat ii. Over the counter pain medication iii. Prescription pain medication b. Most patients will experience some swelling and bruising around the incisions.  Ice packs or heating pads (30-60 minutes up to 6 times a day) will help. Use ice for the first few days to help decrease swelling and bruising, then switch to heat to help relax tight/sore spots and speed recovery.  Some people prefer to use ice alone, heat alone, alternating between ice & heat.  Experiment to what works for you.  Swelling and bruising can take several weeks to resolve.   c. It is helpful to take an over-the-counter pain medication  regularly for the first few weeks.  Choose one of the following that works best for you: i. Naproxen (Aleve, etc)  Two 220mg  tabs twice a day ii. Ibuprofen (Advil, etc) Three 200mg  tabs four times a day (every meal & bedtime) iii. Acetaminophen (Tylenol, etc) 500-650mg  four times a day (every meal & bedtime) d. A  prescription for pain medication (such as oxycodone, hydrocodone, etc) should be given to you upon discharge.  Take your pain medication as prescribed.  i. If you are having problems/concerns with the prescription medicine (does not control pain, nausea, vomiting, rash, itching, etc), please call [phone] to see if we need to switch you to a different pain medicine that will work better for you and/or control your side effect better. ii. If you need a refill on your pain medication, please contact your pharmacy.  They will contact our office to request authorization. Prescriptions will not be filled after 5 pm or on week-ends. 4. Avoid getting constipated.  Between the surgery and the pain medications, it is common to experience some constipation.  Increasing fluid intake and taking a fiber supplement (such as Metamucil, Citrucel, FiberCon, MiraLax, etc) 1-2 times a day regularly will usually help prevent this problem from occurring.  A mild laxative (prune juice, Milk of Magnesia, MiraLax, etc) should be taken according to package directions if there are no bowel movements after 48 hours.    5. Wash / shower every day.  You may shower over the dressings as they are waterproof.  Continue to shower over incision(s) after the dressing is off.    6.  WOUND CARE 7.  Remove your outside bandages the next day after surgery.    If your incision ois closed, you may leave most of your lower incision (intergluteal cleft over tailbone) open to air.  You will have purple Dermabond plastic scab or skin tapes (Steri Strips) covering the incision.  Leave them on until one week, then remove.     YOU HAVE SMALL RIBBON WICKS above the upper part of your tailbone incision on your lower back that will allow the deeper part of the closed incision to drain & close down.   There will be drainage from the wicks, usually thin & bloody.  Replace clean 4x4 gauze to cover the wicks that drain at least every day (remove dressing, shower/bathe, dry skin, replace a new gauze dressing).  Replace the dressing more often as needed Keep the area clean & dry.   You will need to come to the office to have the wicks removed by an office nurse.  Usually: -return to [facility] office 1 week from surgery to have superficial (RIGHT) wick removed by office nurse -return to [facility] office 2nd week after surgery to have the deeper (LEFT) wick removed by office nurse -return to me Dr Michaell Cowing ~ 3 weeks to check on incision.     8. ACTIVITIES as tolerated:   a. You may resume regular (light) daily activities beginning the next day--such as daily self-care, walking, climbing stairs--gradually increasing activities as tolerated.  If you can walk 30 minutes without difficulty, it is safe to try more intense activity such as jogging, treadmill, bicycling, low-impact aerobics, swimming, etc. b. Save the most intensive and strenuous activity for last such as sit-ups, heavy lifting, contact sports, etc  Refrain from any heavy lifting or straining until you are off narcotics for pain control.   c. DO NOT PUSH THROUGH PAIN.  Let pain be your guide: If it hurts to do something, don't do it.  Pain is your body warning you to avoid that activity for another week until the pain goes down. d. You may drive when you are no longer taking prescription pain medication, you can comfortably wear a seatbelt, and you can safely maneuver your car and apply brakes. e. Bonita Quin may have sexual intercourse when it is comfortable.  9. FOLLOW UP in our office a. Please call [facility] at [phone] to set up an appointment to see your surgeon in the office for a  follow-up appointment approximately 7-10 days after your surgery to have one of your ribbon wicks removed by the office nurse.  The other remaining wick usually is removed the next week after that when you meet your surgeon again for a post-operative visit. b. Make sure that you call for these appointments the day you arrive home to ensure a convenient appointment time. 9. IF YOU HAVE DISABILITY OR FAMILY LEAVE FORMS, BRING THEM TO THE OFFICE FOR PROCESSING.  DO NOT GIVE THEM TO YOUR DOCTOR.   WHEN TO CALL us [phone]: 1. Poor pain control 2. Reactions / problems with new medications (rash/itching, nausea, etc)  3. Fever over 101.5 F (38.5 C) 4. Worsening swelling or bruising 5. Continued bleeding from incision. 6. Increased pain, redness, or drainage from the incision 7. Difficulty breathing / swallowing   The clinic staff is available to answer your questions during regular business hours (8:30am-5pm).  Please dont hesitate to call and ask to speak to one of our nurses for clinical concerns.   If you have a  medical emergency, go to the nearest emergency room or call 911.  A surgeon from [facility] is always on call at the [facility]   [facility] [place] [address], [address], [place], [place]  [id number] ? MAIN: [phone] ? TOLL FREE: [phone] ?  FAX [phone] [email or web address]   Post Anesthesia Home Care Instructions  Activity: Get plenty of rest for the remainder of the day. A responsible individual must stay with you for 24 hours following the procedure.  For the next 24 hours, DO NOT: -Drive a car -Advertising copywriterperate machinery -Drink alcoholic beverages -Take any medication unless instructed by your physician -Make any legal decisions or sign important papers.  Meals: Start with liquid foods such as gelatin or soup. Progress to regular foods as tolerated. Avoid greasy, spicy, heavy foods. If nausea and/or vomiting  occur, drink only clear liquids until the nausea and/or vomiting subsides. Call your physician if vomiting continues.  Special Instructions/Symptoms: Your throat may feel dry or sore from the anesthesia or the breathing tube placed in your throat during surgery. If this causes discomfort, gargle with warm salt water. The discomfort should disappear within 24 hours.  If you had a scopolamine patch placed behind your ear for the management of post- operative nausea and/or vomiting:  1. The medication in the patch is effective for 72 hours, after which it should be removed.  Wrap patch in a tissue and discard in the trash. Wash hands thoroughly with soap and water. 2. You may remove the patch earlier than 72 hours if you experience unpleasant side effects which may include dry mouth, dizziness or visual disturbances. 3. Avoid touching the patch. Wash your hands with soap and water after contact with the patch.   Information for Discharge Teaching:  EXPAREL (bupivacaine liposome injectable suspension)   Your surgeon gave you EXPAREL(bupivacaine) in your surgical incision to help control your pain after surgery.   EXPAREL is a local anesthetic that provides pain relief by numbing the tissue around the surgical site.  EXPAREL is designed to release pain medication over time and can control pain for up to 72 hours.  Depending on how you respond to EXPAREL, you may require less pain medication during your recovery.  Possible side effects:  Temporary loss of sensation or ability to move in the area where bupivacaine was injected.  Nausea, vomiting, constipation  Rarely, numbness and tingling in your mouth or lips, lightheadedness, or anxiety may occur.  Call your doctor right away if you think you may be experiencing any of these sensations, or if you have other questions regarding possible side effects.  Follow all other discharge instructions given to you by your surgeon or nurse. Eat a  healthy diet and drink plenty of water or other fluids.  If you return to the hospital for any reason within 96 hours following the administration of EXPAREL, please inform your health care providers.  STOP SMOKING!  We strongly recommend that you stop smoking.  Smoking increases the risk of surgery including infection in the form of an open wound, pus formation, abscess, hernia at an incision on the abdomen, etc.  You have an increased risk of other MAJOR complications such as stroke, heart attack, forming clots in the leg and/or lungs, and death.    Smoking Cessation Quitting smoking is important to your health and has many advantages. However, it is not always easy to quit since nicotine is a very addictive drug. Often times, people try 3  times or more before being able to quit. This document explains the best ways for you to prepare to quit smoking. Quitting takes hard work and a lot of effort, but you can do it. ADVANTAGES OF QUITTING SMOKING  You will live longer, feel better, and live better.  Your body will feel the impact of quitting smoking almost immediately.  Within 20 minutes, blood pressure decreases. Your pulse returns to its normal level.  After 8 hours, carbon monoxide levels in the blood return to normal. Your oxygen level increases.  After 24 hours, the chance of having a heart attack starts to decrease. Your breath, hair, and body stop smelling like smoke.  After 48 hours, damaged nerve endings begin to recover. Your sense of taste and smell improve.  After 72 hours, the body is virtually free of nicotine. Your bronchial tubes relax and breathing becomes easier.  After 2 to 12 weeks, lungs can hold more air. Exercise becomes easier and circulation improves.  The risk of having a heart attack, stroke, cancer, or lung disease is greatly reduced.  After 1 year, the risk of coronary heart disease is cut in half.  After 5 years, the risk of stroke falls to the same as a  nonsmoker.  After 10 years, the risk of lung cancer is cut in half and the risk of other cancers decreases significantly.  After 15 years, the risk of coronary heart disease drops, usually to the level of a nonsmoker.  If you are pregnant, quitting smoking will improve your chances of having a healthy baby.  The people you live with, especially any children, will be healthier.  You will have extra money to spend on things other than cigarettes. QUESTIONS TO THINK ABOUT BEFORE ATTEMPTING TO QUIT You may want to talk about your answers with your caregiver.  Why do you want to quit?  If you tried to quit in the past, what helped and what did not?  What will be the most difficult situations for you after you quit? How will you plan to handle them?  Who can help you through the tough times? Your family? Friends? A caregiver?  What pleasures do you get from smoking? What ways can you still get pleasure if you quit? Here are some questions to ask your caregiver:  How can you help me to be successful at quitting?  What medicine do you think would be best for me and how should I take it?  What should I do if I need more help?  What is smoking withdrawal like? How can I get information on withdrawal? GET READY  Set a quit date.  Change your environment by getting rid of all cigarettes, ashtrays, matches, and lighters in your home, car, or work. Do not let people smoke in your home.  Review your past attempts to quit. Think about what worked and what did not. GET SUPPORT AND ENCOURAGEMENT You have a better chance of being successful if you have help. You can get support in many ways.  Tell your family, friends, and co-workers that you are going to quit and need their support. Ask them not to smoke around you.  Get individual, group, or telephone counseling and support. Programs are available at [employer] and health centers. Call your local health department for information  about programs in your area.  Spiritual beliefs and practices may help some smokers quit.  Download a "quit meter" on your computer to keep track of quit statistics, such as  how long you have gone without smoking, cigarettes not smoked, and money saved.  Get a self-help book about quitting smoking and staying off of tobacco. LEARN NEW SKILLS AND BEHAVIORS  Distract yourself from urges to smoke. Talk to someone, go for a walk, or occupy your time with a task.  Change your normal routine. Take a different route to work. Drink tea instead of coffee. Eat breakfast in a different place.  Reduce your stress. Take a hot bath, exercise, or read a book.  Plan something enjoyable to do every day. Reward yourself for not smoking.  Explore interactive web-based programs that specialize in helping you quit. GET MEDICINE AND USE IT CORRECTLY Medicines can help you stop smoking and decrease the urge to smoke. Combining medicine with the above behavioral methods and support can greatly increase your chances of successfully quitting smoking.  Nicotine replacement therapy helps deliver nicotine to your body without the negative effects and risks of smoking. Nicotine replacement therapy includes nicotine gum, lozenges, inhalers, nasal sprays, and skin patches. Some may be available over-the-counter and others require a prescription.  Antidepressant medicine helps people abstain from smoking, but how this works is unknown. This medicine is available by prescription.  Nicotinic receptor partial agonist medicine simulates the effect of nicotine in your brain. This medicine is available by prescription. Ask your caregiver for advice about which medicines to use and how to use them based on your health history. Your caregiver will tell you what side effects to look out for if you choose to be on a medicine or therapy. Carefully read the information on the package. Do not use any other product containing nicotine  while using a nicotine replacement product.  RELAPSE OR DIFFICULT SITUATIONS Most relapses occur within the first 3 months after quitting. Do not be discouraged if you start smoking again. Remember, most people try several times before finally quitting. You may have symptoms of withdrawal because your body is used to nicotine. You may crave cigarettes, be irritable, feel very hungry, cough often, get headaches, or have difficulty concentrating. The withdrawal symptoms are only temporary. They are strongest when you first quit, but they will go away within 10 14 days. To reduce the chances of relapse, try to:  Avoid drinking alcohol. Drinking lowers your chances of successfully quitting.  Reduce the amount of caffeine you consume. Once you quit smoking, the amount of caffeine in your body increases and can give you symptoms, such as a rapid heartbeat, sweating, and anxiety.  Avoid smokers because they can make you want to smoke.  Do not let weight gain distract you. Many smokers will gain weight when they quit, usually less than 10 pounds. Eat a healthy diet and stay active. You can always lose the weight gained after you quit.  Find ways to improve your mood other than smoking. FOR MORE INFORMATION  www.smokefree.gov    While it can be one of the most difficult things to do, the [place] community has programs to help you stop.  Consider talking with your primary care physician about options.  Also, Smoking Cessation classes are available through the [facility]:  The smoking cessation program is a proven-effective program from the American Lung Association. The program is available for anyone 53 and older who currently smokes. The program lasts for 7 weeks and is 8 sessions. Each class will be approximately 1 1/2 hours. The program is every Tuesday.  All classes are 12-1:30pm and same location.  Event Location Information:  Location: [facility] 2nd Floor Conference Room 2-037;  located next to [facility] cross streets: [facility] Entrance into the [facility] is adjacent to the [employer] main entrance. The conference room is located on the 2nd floor.  Parking Instructions: Visitor parking is adjacent to [employer] main entrance and the Cancer Center    A smoking cessation program is also offered through the [facility]. Register online at [email or web address] or call [phone] for more information.   Tobacco cessation counseling is available at [facility]. Call [phone] for a free appointment.   Tobacco cessation classes also are available through the [facility] in [place]. For information, call [phone].   The Patient Education Network features videos on tobacco cessation. Please consult your listings in the center of this book to find instructions on how to access this resource.   If you want more information, ask your nurse.

## 2018-01-30 NOTE — Transfer of Care (Signed)
Immediate Anesthesia Transfer of Care Note  Patient: Lisa Powers  Procedure(s) Performed: EXCISION PILONIDAL DISEASE EXTENSIVE ERAS PATHWAY (N/A Buttocks)  Patient Location: PACU  Anesthesia Type:General  Level of Consciousness: awake and patient cooperative  Airway & Oxygen Therapy: Patient Spontanous Breathing  Post-op Assessment: Report given to RN and Post -op Vital signs reviewed and stable  Post vital signs: Reviewed and stable  Last Vitals:  Vitals Value Taken Time  BP 148/110 01/30/2018 12:55 PM  Temp    Pulse 101 01/30/2018 12:55 PM  Resp 15 01/30/2018 12:55 PM  SpO2 97 % 01/30/2018 12:55 PM    Last Pain:  Vitals:   01/30/18 1255  TempSrc:   PainSc: Asleep      Patients Stated Pain Goal: 5 (01/30/18 1049)  Complications: No apparent anesthesia complications

## 2018-01-30 NOTE — Op Note (Signed)
01/30/2018  12:54 PM  PATIENT:  Lisa CheeksLeanna M Self  27 y.o. female  Patient Care Team: Patient, No Pcp Per as PCP - General (General Practice) Karie SodaGross, Shanaye Rief, MD as Consulting Physician (General Surgery) Starkes, Juel Burrowakia S, FNP as Nurse Practitioner (Nurse Practitioner)  PRE-OPERATIVE DIAGNOSIS:  Pilonidal disease with recurrent infection  POST-OPERATIVE DIAGNOSIS:  Pilonidal disease with recurrent infection  PROCEDURE:  EXCISION PILONIDAL DISEASE   SURGEON:  Ardeth SportsmanSteven C. Zalen Sequeira, MD  ASSISTANT: Hedda SladePuja Gosai, PA-C  ANESTHESIA:   local and general  EBL:  Total I/O In: 1000 [I.V.:1000] Out: 10 [Blood:10]  Delay start of Pharmacological VTE agent (>24hrs) due to surgical blood loss or risk of bleeding:  no  DRAINS:  2 WICKS (1/4 NU Gauze tape) IN THE LOWER BACK secured with silk suture  1.  Right =superificial SQ 2.  Left = deep   SPECIMEN:  Source of Specimen:  PILONIDAL DISEASE  DISPOSITION OF SPECIMEN:  PATHOLOGY  COUNTS:  YES  PLAN OF CARE: Discharge to home after PACU  PATIENT DISPOSITION:  PACU - hemodynamically stable.  INDICATION: Pleasant person with chronic drainage in the intergluteal space and pits suspicious for pilonidal disease.  I recommended excision with layered closure over wicks:  The anatomy of the intragluteal cleft was discussed. Pathophysiology of pilonidal disease was discussed. The importance of keeping hairs trimmed to avoid recurrence was discussed. Discussion of options such as curretage, excision with closure vs leaving open was discussed. Risks of infection with need for incision and drainage & antibiotics were discussed.  I noted a good likelihood this will help address the problem.    At this point, I think the patient would best served with considering surgery to excise the diseased tissue. I will make an attempt to close but it may need to be left open to allow it to heal with secondary intention and wound packing. Possible recurrences need reoperation  or different techniques were discussed as well. I noted that recurrence is higher with poor compliance on hair removal hygiene & overall health.  Risks of bleeding, infection, injury to other organs, need for repair of tissues / organs, need for further treatment, and other risks discussed. The patient's questions were answered. The patient agrees to proceed.  OR FINDINGS:  Intragluteal upper small midline pits consistent with chronic pilonidal disease.   No major abscess.  7x3cm wound 6cm deep.  9x9cm wound at base  Layered closure with absorbable suture.  Wicks as noted above.  The right lower back wick is the superficial one.  That is the one that should be removed first in 7 days.  DESCRIPTION:   Informed consent was confirmed.  Patient underwent general anesthesia without difficulty.  She was positioned prone.  The lower back and intergluteal region was prepped and draped in the sterile fashion.  Excess air is removed.  Surgical timeout confirmed our plan.  I did examination with findings noted above.  I did a paramedian vertical fusiform excision of the pilonidal disease.  I came down to the sacrum to remove all scar tissue and sinus tracts/pilonidal disease.  Specimen removed intact.  I created deep flaps off the skin and subcutaneous tissue fascia and even superficial gluteus muscle off the sacrum 3 centimeters laterally on both sides to have good mobility for thick flaps.  Freed the subcutaneous tissues off the deeper fascia as well.   I did copious irrigation after I had assured hemostasis.  I placed a 1/4 NU Gauze wick overlying the sacrum and  it tunneled it to the left lower back.  I closed at the level of the gluteal fascia and muscle using interrupted 2-0 Vicryl suture to good result.  I few tacked to the sacral periosteum to close the dead space.  I tunnelled a wick in the superficial region.  I did another layer closure of absorbable Vicryl suture at Scarpa's and then another layer of  deep dermal sutures interrupted.  I then closed the skin with a running Monocryl subcuticular suture.  I then used Dermabond skin glue to seal the incision.  I had secured the NU Gauze wicks with silk suture.  I placed a sterile dressing over the wicks.  Patient being extubated go to recovery room.  I had discussed postop care with the patient.  Patient had received instructions in the office.  Pain prescription written.  I discussed operative findings, updated the patient's status, discussed probable steps to recovery, and gave postoperative recommendations to the patient's family.  Recommendations were made.  Questions were answered.  They expressed understanding & appreciation.  [name], M.D., F.A.C.S. Gastrointestinal and Minimally Invasive Surgery [facility] [address], [address] [place], [place] [address] [phone] Main / Paging

## 2018-01-31 ENCOUNTER — Encounter (HOSPITAL_BASED_OUTPATIENT_CLINIC_OR_DEPARTMENT_OTHER): Payer: Self-pay | Admitting: Surgery

## 2018-01-31 LAB — POCT HEMOGLOBIN-HEMACUE: Hemoglobin: 12.9 g/dL (ref 12.0–15.0)

## 2018-01-31 NOTE — Anesthesia Postprocedure Evaluation (Signed)
Anesthesia Post Note  Patient: Osie CheeksLeanna M Self  Procedure(s) Performed: EXCISION PILONIDAL DISEASE EXTENSIVE ERAS PATHWAY (N/A Buttocks)     Patient location during evaluation: PACU Anesthesia Type: General Level of consciousness: awake and alert Pain management: pain level controlled Vital Signs Assessment: post-procedure vital signs reviewed and stable Respiratory status: spontaneous breathing, nonlabored ventilation, respiratory function stable and patient connected to nasal cannula oxygen Cardiovascular status: blood pressure returned to baseline and stable Postop Assessment: no apparent nausea or vomiting Anesthetic complications: no    Last Vitals:  Vitals:   01/30/18 1345 01/30/18 1450  BP: 121/73 130/77  Pulse: 76 66  Resp: (!) 21 14  Temp:  37.2 C  SpO2: 97% 97%    Last Pain:  Vitals:   01/30/18 1450  TempSrc:   PainSc: 3                  Shelton SilvasKevin D Zalma Channing

## 2019-06-04 ENCOUNTER — Other Ambulatory Visit: Payer: Self-pay

## 2019-06-04 DIAGNOSIS — Z20822 Contact with and (suspected) exposure to covid-19: Secondary | ICD-10-CM

## 2019-06-05 LAB — NOVEL CORONAVIRUS, NAA: SARS-CoV-2, NAA: NOT DETECTED

## 2019-06-06 NOTE — H&P (Signed)
Lisa Powers is an 28 y.o. female G18P1011 diagnosed with missed abortion measuring [redacted]w[redacted]d.  Patient was counseled re: treatment options and elected to proceed with D&E.  Blood type A pos.    Pertinent Gynecological History: Menses: n/a Bleeding: n/a Contraception: none DES exposure: unknown Blood transfusions: none Sexually transmitted diseases: no past history Previous GYN Procedures: C/S  Last mammogram: n/a Date: n/a Last pap: normal Date: 03/2017 OB History: G2, P1011   Menstrual History: Menarche age: n/a No LMP recorded.    Past Medical History:  Diagnosis Date  . GERD (gastroesophageal reflux disease)   . History of gestational hypertension   . Pilonidal disease    W/ RECURRENT INFECTION  . Seasonal asthma   . Wears contact lenses     Past Surgical History:  Procedure Laterality Date  . CESAREAN SECTION N/A 10/16/2017   Procedure: CESAREAN SECTION;  Surgeon: Everlene Farrier, MD;  Location: [facility];  Service: Obstetrics;  Laterality: N/A;  . PILONIDAL CYST EXCISION N/A 01/30/2018   Procedure: EXCISION PILONIDAL DISEASE EXTENSIVE ERAS PATHWAY;  Surgeon: Michael Boston, MD;  Location: [facility];  Service: General;  Laterality: N/A;    Family History  Problem Relation Age of Onset  . Hyperlipidemia Mother   . Hyperlipidemia Father   . Heart disease Maternal Grandmother     Social History:  reports that she has been smoking cigarettes. She has been smoking about 0.25 packs per day. She has never used smokeless tobacco. She reports that she does not drink alcohol or use drugs.  Allergies:  Allergies  Allergen Reactions  . Claritin [Loratadine] Rash    No medications prior to admission.    ROS  currently breastfeeding. Physical Exam  Constitutional: She is oriented to person, place, and time. She appears well-developed and well-nourished.  HENT:  Head: Normocephalic and atraumatic.  Neck: Normal range of motion. No thyromegaly  present.  Cardiovascular: Normal rate and regular rhythm.  Respiratory: Effort normal and breath sounds normal.  GI: Soft. There is no rebound and no guarding.  Genitourinary:    Vagina normal.   Musculoskeletal: Normal range of motion.  Neurological: She is alert and oriented to person, place, and time.  Skin: Skin is warm and dry.  Psychiatric: She has a normal mood and affect. Her behavior is normal.    No results found for this or any previous visit (from the past 24 hour(s)).  No results found.  Assessment/Plan: 27yo with missed abortion  -Patient is counseled re: risks of D&E including bleeding, infection, scarring, and damage to surrounding structures.  All questions were answered and the patient wishes to proceed.  Lisa Powers 06/06/2019, 1:36 PM

## 2019-06-09 ENCOUNTER — Encounter (HOSPITAL_COMMUNITY): Payer: Self-pay | Admitting: *Deleted

## 2019-06-09 ENCOUNTER — Other Ambulatory Visit (HOSPITAL_COMMUNITY)
Admission: RE | Admit: 2019-06-09 | Discharge: 2019-06-09 | Disposition: A | Discharge status: Home / Self Care | Payer: BC Managed Care – PPO | Source: Ambulatory Visit | Attending: Obstetrics & Gynecology | Admitting: Obstetrics & Gynecology

## 2019-06-09 ENCOUNTER — Other Ambulatory Visit: Payer: Self-pay

## 2019-06-09 DIAGNOSIS — Z01812 Encounter for preprocedural laboratory examination: Secondary | ICD-10-CM | POA: Insufficient documentation

## 2019-06-09 DIAGNOSIS — Z20828 Contact with and (suspected) exposure to other viral communicable diseases: Secondary | ICD-10-CM | POA: Insufficient documentation

## 2019-06-09 LAB — SARS CORONAVIRUS 2 (TAT 6-24 HRS): SARS Coronavirus 2: NEGATIVE

## 2019-06-09 MED ORDER — SODIUM CHLORIDE 0.9 % IV SOLN
100.0000 mg | INTRAVENOUS | Status: AC
  Administered 2019-06-10: 200 mg via INTRAVENOUS
  Filled 2019-06-09: qty 100

## 2019-06-09 NOTE — Progress Notes (Signed)
Patient denies shortness of breath, fever, cough and chest pain.  PCP - Dr [name] Cardiologist - Denies  Chest x-ray - 10/10/17 EKG - Denies Stress Test - Denies ECHO - Denies Cardiac Cath - Denies  ERAS: Clears til 9:15 am DOS, no drink  Anesthesia review: No  STOP now taking any Aspirin (unless otherwise instructed by your surgeon), Aleve, Naproxen, Ibuprofen, Motrin, Advil, Goody's, BC's, all herbal medications, fish oil, and all vitamins.   Coronavirus Screening Have you or experienced the following symptoms:  Cough yes/no: No Fever (>100.25F)  yes/no: No Runny nose yes/no: No Sore throat yes/no: No Difficulty breathing/shortness of breath  yes/no: No  Have you or  traveled in the last 14 days and where? yes/no: No  Patient verbalized understanding of instructions that were given to her via phone.

## 2019-06-10 ENCOUNTER — Encounter (HOSPITAL_COMMUNITY): Payer: Self-pay

## 2019-06-10 ENCOUNTER — Other Ambulatory Visit: Payer: Self-pay

## 2019-06-10 ENCOUNTER — Ambulatory Visit (HOSPITAL_COMMUNITY): Payer: BC Managed Care – PPO | Admitting: Registered Nurse

## 2019-06-10 ENCOUNTER — Ambulatory Visit (HOSPITAL_COMMUNITY)
Admission: RE | Admit: 2019-06-10 | Discharge: 2019-06-10 | Disposition: A | Discharge status: Home / Self Care | Payer: BC Managed Care – PPO | Attending: Obstetrics & Gynecology | Admitting: Obstetrics & Gynecology

## 2019-06-10 ENCOUNTER — Encounter (HOSPITAL_COMMUNITY)
Admission: RE | Disposition: A | Discharge status: Home / Self Care | Payer: Self-pay | Source: Home / Self Care | Attending: Obstetrics & Gynecology

## 2019-06-10 DIAGNOSIS — K219 Gastro-esophageal reflux disease without esophagitis: Secondary | ICD-10-CM | POA: Insufficient documentation

## 2019-06-10 DIAGNOSIS — O021 Missed abortion: Secondary | ICD-10-CM

## 2019-06-10 DIAGNOSIS — F17210 Nicotine dependence, cigarettes, uncomplicated: Secondary | ICD-10-CM | POA: Diagnosis not present

## 2019-06-10 HISTORY — PX: DILATION AND EVACUATION: SHX1459

## 2019-06-10 LAB — CBC
HCT: 40.6 % (ref 36.0–46.0)
Hemoglobin: 13.2 g/dL (ref 12.0–15.0)
MCH: 29.6 pg (ref 26.0–34.0)
MCHC: 32.5 g/dL (ref 30.0–36.0)
MCV: 91 fL (ref 80.0–100.0)
Platelets: 270 10*3/uL (ref 150–400)
RBC: 4.46 MIL/uL (ref 3.87–5.11)
RDW: 13.3 % (ref 11.5–15.5)
WBC: 5.8 10*3/uL (ref 4.0–10.5)
nRBC: 0 % (ref 0.0–0.2)

## 2019-06-10 LAB — TYPE AND SCREEN
ABO/RH(D): A POS
Antibody Screen: NEGATIVE

## 2019-06-10 LAB — ABO/RH: ABO/RH(D): A POS

## 2019-06-10 SURGERY — DILATION AND EVACUATION, UTERUS
Anesthesia: Choice | Site: Vagina

## 2019-06-10 MED ORDER — LACTATED RINGERS IV SOLN
INTRAVENOUS | Status: DC
  Administered 2019-06-10: 11:00:00 via INTRAVENOUS

## 2019-06-10 MED ORDER — PROPOFOL 10 MG/ML IV BOLUS
INTRAVENOUS | Status: AC
  Filled 2019-06-10: qty 20

## 2019-06-10 MED ORDER — FENTANYL CITRATE (PF) 100 MCG/2ML IJ SOLN
INTRAMUSCULAR | Status: DC | PRN
  Administered 2019-06-10: 50 ug via INTRAVENOUS

## 2019-06-10 MED ORDER — LIDOCAINE 2% (20 MG/ML) 5 ML SYRINGE
INTRAMUSCULAR | Status: DC | PRN
  Administered 2019-06-10: 100 mg via INTRAVENOUS

## 2019-06-10 MED ORDER — CHLOROPROCAINE HCL 1 % IJ SOLN
INTRAMUSCULAR | Status: AC
  Filled 2019-06-10: qty 30

## 2019-06-10 MED ORDER — FENTANYL CITRATE (PF) 250 MCG/5ML IJ SOLN
INTRAMUSCULAR | Status: AC
  Filled 2019-06-10: qty 5

## 2019-06-10 MED ORDER — PHENYLEPHRINE 40 MCG/ML (10ML) SYRINGE FOR IV PUSH (FOR BLOOD PRESSURE SUPPORT)
PREFILLED_SYRINGE | INTRAVENOUS | Status: DC | PRN
  Administered 2019-06-10 (×2): 80 ug via INTRAVENOUS

## 2019-06-10 MED ORDER — DOXYCYCLINE HYCLATE 100 MG PO TABS
200.0000 mg | ORAL_TABLET | ORAL | Status: DC
  Filled 2019-06-10: qty 2

## 2019-06-10 MED ORDER — DEXAMETHASONE SODIUM PHOSPHATE 10 MG/ML IJ SOLN
INTRAMUSCULAR | Status: DC | PRN
  Administered 2019-06-10: 4 mg via INTRAVENOUS

## 2019-06-10 MED ORDER — OXYCODONE-ACETAMINOPHEN 5-325 MG PO TABS: 1.0000 | ORAL_TABLET | ORAL | 0 refills | Status: DC | PRN

## 2019-06-10 MED ORDER — PROPOFOL 10 MG/ML IV BOLUS
INTRAVENOUS | Status: DC | PRN
  Administered 2019-06-10: 150 mg via INTRAVENOUS

## 2019-06-10 MED ORDER — MIDAZOLAM HCL 2 MG/2ML IJ SOLN
INTRAMUSCULAR | Status: AC
  Filled 2019-06-10: qty 2

## 2019-06-10 MED ORDER — IBUPROFEN 800 MG PO TABS: 800.0000 mg | ORAL_TABLET | Freq: Four times a day (QID) | ORAL | 0 refills | Status: DC | PRN

## 2019-06-10 MED ORDER — ONDANSETRON HCL 4 MG/2ML IJ SOLN
INTRAMUSCULAR | Status: DC | PRN
  Administered 2019-06-10: 4 mg via INTRAVENOUS

## 2019-06-10 MED ORDER — MIDAZOLAM HCL 2 MG/2ML IJ SOLN
INTRAMUSCULAR | Status: DC | PRN
  Administered 2019-06-10: 2 mg via INTRAVENOUS

## 2019-06-10 SURGICAL SUPPLY — 22 items
CATH ROBINSON RED A/P 16FR (CATHETERS) ×3 IMPLANT
DECANTER SPIKE VIAL GLASS SM (MISCELLANEOUS) ×3 IMPLANT
FILTER UTR ASPR ASSEMBLY (MISCELLANEOUS) ×3 IMPLANT
GLOVE BIO SURGEON STRL SZ 6 (GLOVE) ×3 IMPLANT
GLOVE BIOGEL PI IND STRL 6 (GLOVE) ×1 IMPLANT
GLOVE BIOGEL PI IND STRL 7.0 (GLOVE) ×1 IMPLANT
GLOVE BIOGEL PI INDICATOR 6 (GLOVE) ×2
GLOVE BIOGEL PI INDICATOR 7.0 (GLOVE) ×2
GOWN STRL REUS W/ TWL LRG LVL3 (GOWN DISPOSABLE) ×2 IMPLANT
GOWN STRL REUS W/TWL LRG LVL3 (GOWN DISPOSABLE) ×6
HOSE CONNECTING 18IN BERKELEY (TUBING) ×3 IMPLANT
KIT BERKELEY 1ST TRIMESTER 3/8 (MISCELLANEOUS) ×6 IMPLANT
NS IRRIG 1000ML POUR BTL (IV SOLUTION) ×3 IMPLANT
PACK VAGINAL MINOR WOMEN LF (CUSTOM PROCEDURE TRAY) ×3 IMPLANT
PAD OB MATERNITY 4.3X12.25 (PERSONAL CARE ITEMS) ×3 IMPLANT
SET BERKELEY SUCTION TUBING (SUCTIONS) ×3 IMPLANT
TOWEL GREEN STERILE FF (TOWEL DISPOSABLE) ×6 IMPLANT
UNDERPAD 30X30 (UNDERPADS AND DIAPERS) ×3 IMPLANT
VACURETTE 10 RIGID CVD (CANNULA) IMPLANT
VACURETTE 7MM CVD STRL WRAP (CANNULA) IMPLANT
VACURETTE 8 RIGID CVD (CANNULA) IMPLANT
VACURETTE 9 RIGID CVD (CANNULA) ×2 IMPLANT

## 2019-06-10 NOTE — Anesthesia Preprocedure Evaluation (Signed)
Anesthesia Evaluation  Patient identified by MRN, date of birth, ID band Patient awake    Reviewed: Allergy & Precautions, NPO status , Patient's Chart, lab work & pertinent test results  Airway Mallampati: II  TM Distance: >3 FB Neck ROM: Full    Dental   Pulmonary former smoker,    Pulmonary exam normal        Cardiovascular Normal cardiovascular exam     Neuro/Psych Depression    GI/Hepatic GERD  Medicated and Controlled,  Endo/Other    Renal/GU      Musculoskeletal   Abdominal   Peds  Hematology   Anesthesia Other Findings   Reproductive/Obstetrics                             Anesthesia Physical Anesthesia Plan  ASA: III  Anesthesia Plan: General   Post-op Pain Management:    Induction: Intravenous  PONV Risk Score and Plan: 3 and Ondansetron, Midazolam and Treatment may vary due to age or medical condition  Airway Management Planned: LMA  Additional Equipment:   Intra-op Plan:   Post-operative Plan: Extubation in OR  Informed Consent: I have reviewed the patients History and Physical, chart, labs and discussed the procedure including the risks, benefits and alternatives for the proposed anesthesia with the patient or authorized representative who has indicated his/her understanding and acceptance.       Plan Discussed with: CRNA and Surgeon  Anesthesia Plan Comments:         Anesthesia Quick Evaluation

## 2019-06-10 NOTE — Op Note (Signed)
PREOPERATIVE DIAGNOSIS: 27yo withmissed abortion at 8 weeks 3 days  POSTOPERATIVE DIAGNOSIS: The same  PROCEDURE: Suction Dilation and Evacuation  SURGEON: Dr. [name]  INDICATIONS:27y.o. here for scheduled surgery for treatment ofmissed abortion.  Risks of surgery were discussed with the patient including but not limited to: bleeding which may require transfusion; infection which may require antibiotics; injury to uterus or surrounding organs; intrauterine scarring which may impair future fertility; need for additional procedures including laparotomy or laparoscopy; and other postoperative/anesthesia complications. Written informed consent was obtained.   FINDINGS: An 10week sized uterus   ANESTHESIA:GETA  ESTIMATED BLOOD LOSS:50cc  SPECIMENS: POC sent to pathology  COMPLICATIONS: None immediate.  PROCEDURE DETAILS: The patient was taken to the operating room where GETAwas administered.After an adequate timeout was performed, she was placed in the dorsal lithotomy position and examined; then prepped and draped in the sterile manner. Her bladder was catheterized for an unmeasured amount of clear, yellow urine. A speculum was then placed in the patient's vagina and a tenaculumwas applied to the anterior lip of the cervix.The uterus was dilated manually with metal dilators to accommodate the76mm suction curette. Once the cervix was dilated, the suction curette was advanced without difficulty. Suction curettage was performed x 3passes.Sharp curettage was performed and a gritty texture was appreciated globally. A final suction curettage was performed. Thetenaculumwas removed from the anterior lip of the cervix.Hemostasis was observed.The vaginal speculum was removed after noting good hemostasis. The patient tolerated the procedure well and was taken to the recovery area awake and in stable condition.

## 2019-06-10 NOTE — Progress Notes (Signed)
No change to H&P.  Patient declines genetic studies.  Relena Ivancic, DO 

## 2019-06-10 NOTE — Discharge Instructions (Signed)
Managing Pregnancy Loss Pregnancy loss can happen any time during a pregnancy. Often the cause is not known. It is rarely because of anything you did. Pregnancy loss in early pregnancy (during the first trimester) is called a miscarriage. This type of pregnancy loss is the most common. Pregnancy loss that happens after 20 weeks of pregnancy is called fetal demise if the baby's heart stops beating before birth. Fetal demise is much less common. Some women experience spontaneous labor shortly after fetal demise resulting in a stillborn birth (stillbirth). Any pregnancy loss can be devastating. You will need to recover both physically and emotionally. Most women are able to get pregnant again after a pregnancy loss and deliver a healthy baby. How to manage emotional recovery  Pregnancy loss is very hard emotionally. You may feel many different emotions while you grieve. You may feel sad and angry. You may also feel guilty. It is normal to have periods of crying. Emotional recovery can take longer than physical recovery. It is different for everyone. Taking these steps can help you in managing this loss:  Remember that it is unlikely you did anything to cause the pregnancy loss.  Share your thoughts and feelings with friends, family, and your partner. Remember that your partner is also recovering emotionally.  Make sure you have a good support system. Do not spend too much time alone.  Meet with a pregnancy loss counselor or join a pregnancy loss support group.  Get enough sleep and eat a healthy diet. Return to regular exercise when you have recovered physically.  Do not use drugs or alcohol to manage your emotions.  Consider seeing a mental health professional to help you recover emotionally.  Ask a friend or loved one to help you decide what to do with any clothing and nursery items you received for your baby. In the case of a stillbirth, many women benefit from taking additional steps in  the grieving process. You may want to:  Hold your baby after the birth.  Name your baby.  Request a birth certificate.  Create a keepsake such as handprints or footprints.  Dress your baby and have a picture taken.  Make funeral arrangements.  Ask for a baptism or blessing. Hospitals have staff members who can help you with all these arrangements. How to recognize emotional stress It is normal to have emotional stress after a pregnancy loss. But emotional stress that lasts a long time or becomes severe requires treatment. Watch out for these signs of severe emotional stress:  Sadness, anger, or guilt that is not going away and is interfering with your normal activities.  Relationship problems that have occurred or gotten worse since the pregnancy loss.  Signs of depression that last longer than 2 weeks. These may include: ? Sadness. ? Anxiety. ? Hopelessness. ? Loss of interest in activities you enjoy. ? Inability to concentrate. ? Trouble sleeping or sleeping too much. ? Loss of appetite or overeating. ? Thoughts of death or of hurting yourself. Follow these instructions at home:  Take over-the-counter and prescription medicines only as told by your health care provider.  Rest at home until your energy level returns. Return to your normal activities as told by your health care provider. Ask your health care provider what activities are safe for you.  When you are ready, meet with your health care provider to discuss steps to take for a future pregnancy.  Keep all follow-up visits as told by your health care provider. This is  important. Where to find support  To help you and your partner with the process of grieving, talk with your health care provider or seek counseling.  Consider meeting with others who have experienced pregnancy loss. Ask your health care provider about support groups and resources. Where to find more information  U.S. Department of Health and Holiday representative on Women's Health: [place]  American Pregnancy Association: www.americanpregnancy.org Contact a health care provider if:  You continue to experience grief, sadness, or lack of motivation for everyday activities, and those feelings do not improve over time.  You are struggling to recover emotionally, especially if you are using alcohol or substances to help. Get help right away if:  You have thoughts of hurting yourself or others. If you ever feel like you may hurt yourself or others, or have thoughts about taking your own life, get help right away. You can go to your nearest emergency department or call:  Your local emergency services (911 in the [place]  A suicide crisis helpline, such as the [facility] at (704) 616-3003. This is open 24 hours a day. Summary  Any pregnancy loss can be difficult physically and emotionally.  You may experience many different emotions while you grieve. Emotional recovery can last longer than physical recovery.  It is normal to have emotional stress after a pregnancy loss. But emotional stress that lasts a long time or becomes severe requires treatment.  See your health care provider if you are struggling emotionally after a pregnancy loss. This information is not intended to replace advice given to you by your health care provider. Make sure you discuss any questions you have with your health care provider. Document Released: 10/18/2017 Document Revised: 11/27/2018 Document Reviewed: 10/18/2017 Elsevier Patient Education  2020 [employer]. Call MD for T>100.4, heavy vaginal bleeding, severe abdominal pain, or respiratory distress.  Call office to schedule postop appointment in 2 weeks.  Pelvic rest x 4 weeks.  No driving while taking narcotics.

## 2019-06-10 NOTE — Anesthesia Procedure Notes (Signed)
Procedure Name: LMA Insertion Date/Time: 06/10/2019 2:05 PM Performed by: Trinna Post., CRNA Pre-anesthesia Checklist: Patient identified, Emergency Drugs available, Suction available, Patient being monitored and Timeout performed Patient Re-evaluated:Patient Re-evaluated prior to induction Oxygen Delivery Method: Circle system utilized Preoxygenation: Pre-oxygenation with 100% oxygen Induction Type: IV induction LMA: LMA inserted LMA Size: 4.0 Number of attempts: 1 Placement Confirmation: positive ETCO2 and breath sounds checked- equal and bilateral Tube secured with: Tape Dental Injury: Teeth and Oropharynx as per pre-operative assessment

## 2019-06-10 NOTE — Transfer of Care (Signed)
Immediate Anesthesia Transfer of Care Note  Patient: Lisa Powers  Procedure(s) Performed: DILATATION AND EVACUATION (N/A Vagina )  Patient Location: PACU  Anesthesia Type:General  Level of Consciousness: awake, alert  and oriented  Airway & Oxygen Therapy: Patient Spontanous Breathing  Post-op Assessment: Report given to RN  Post vital signs: Reviewed and stable  Last Vitals:  Vitals Value Taken Time  BP 182/115 06/10/19 1443  Temp    Pulse    Resp 24 06/10/19 1444  SpO2    Vitals shown include unvalidated device data.  Last Pain:  Vitals:   06/10/19 1053  TempSrc:   PainSc: 0-No pain         Complications: No apparent anesthesia complications

## 2019-06-10 NOTE — Anesthesia Postprocedure Evaluation (Signed)
Anesthesia Post Note  Patient: Lisa Powers Self  Procedure(s) Performed: DILATATION AND EVACUATION (N/A Vagina )     Patient location during evaluation: PACU Anesthesia Type: General Level of consciousness: awake and alert Pain management: pain level controlled Vital Signs Assessment: post-procedure vital signs reviewed and stable Respiratory status: spontaneous breathing, nonlabored ventilation, respiratory function stable and patient connected to nasal cannula oxygen Cardiovascular status: blood pressure returned to baseline and stable Postop Assessment: no apparent nausea or vomiting Anesthetic complications: no    Last Vitals:  Vitals:   06/10/19 1443 06/10/19 1458  BP: (!) 182/115 128/76  Pulse:  70  Resp: 19 15  Temp: (!) 36.3 C   SpO2: 100% 100%    Last Pain:  Vitals:   06/10/19 1443  TempSrc:   PainSc: 0-No pain                 Muath Hallam DAVID

## 2019-06-11 ENCOUNTER — Encounter (HOSPITAL_COMMUNITY): Payer: Self-pay | Admitting: Obstetrics & Gynecology

## 2019-06-11 LAB — SURGICAL PATHOLOGY

## 2019-08-26 IMAGING — CR DG CHEST 2V
2 series · 2 of 2 positions shown · non-contrast
Comparison: 11/09/2016

CLINICAL DATA: Chest pain with exhaling, cough

EXAM:
CHEST  2 VIEW

[chest pa]
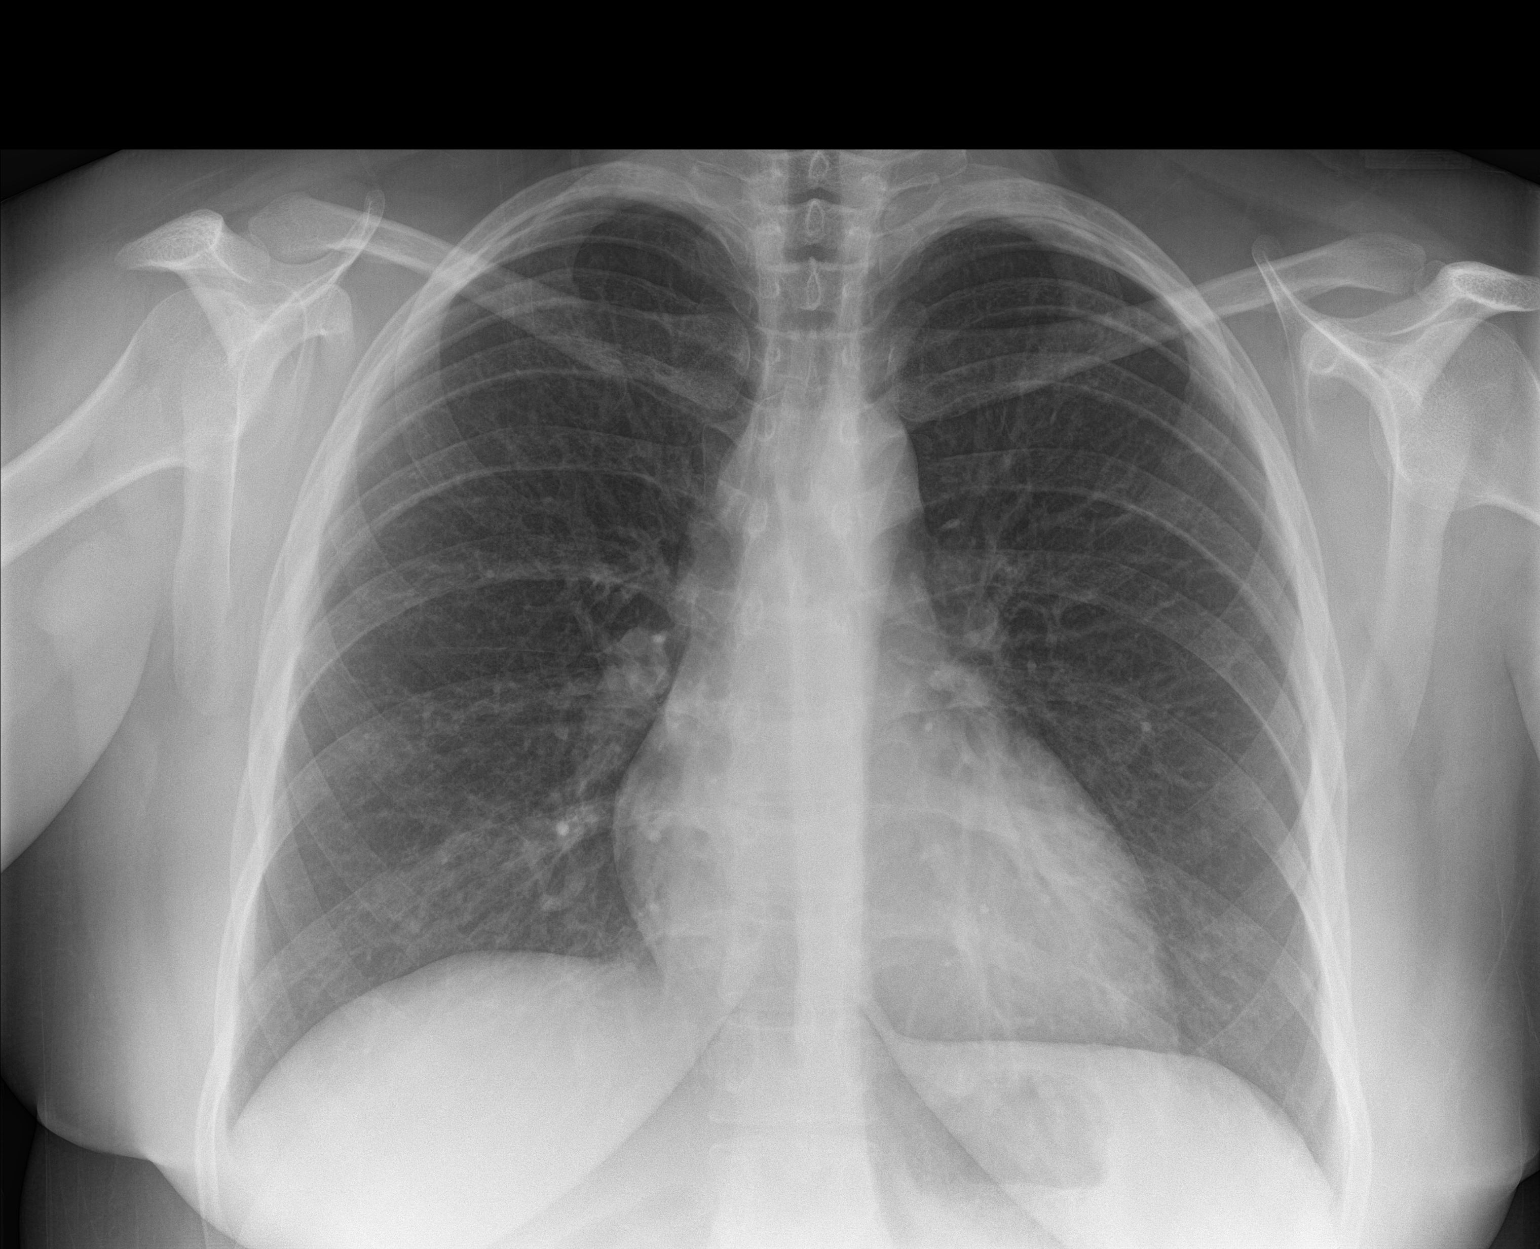

[chest lat]
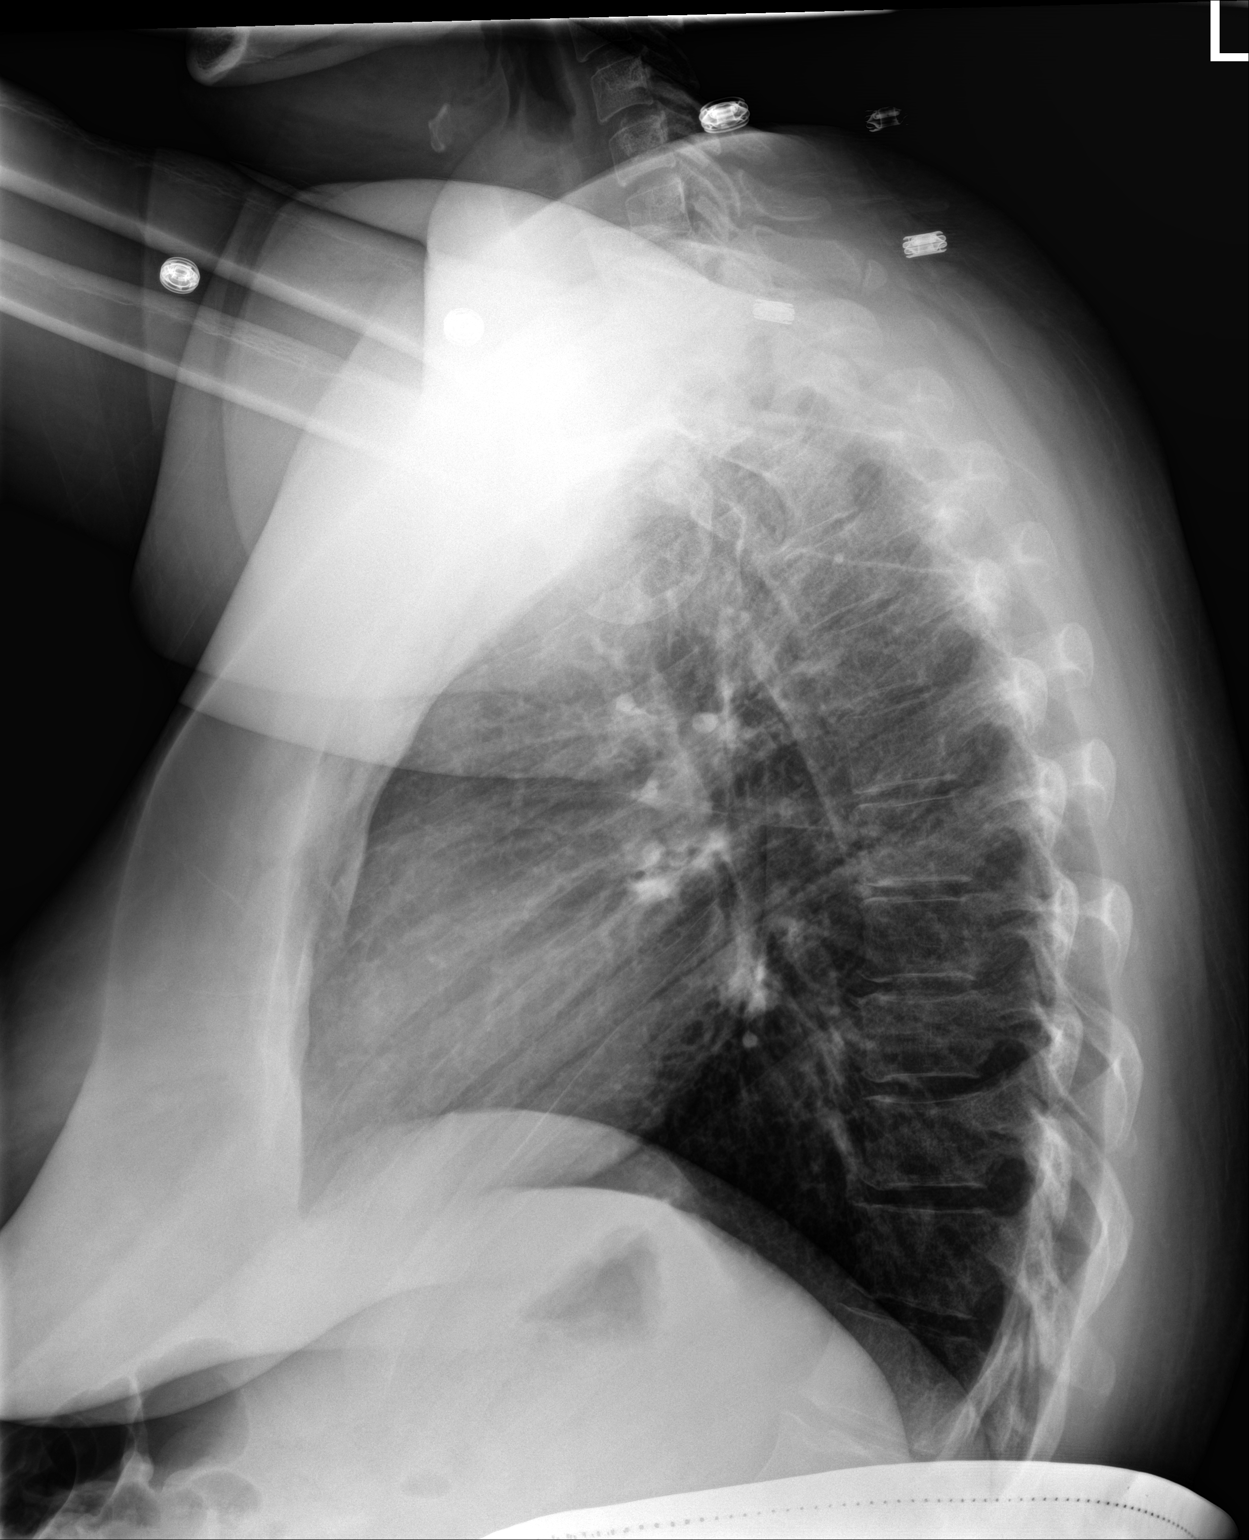

[2 of 2 positions shown; findings below may reference images not displayed]

FINDINGS: The heart size and mediastinal contours are within normal limits.
Both lungs are clear. The visualized skeletal structures are
unremarkable.
IMPRESSION: No active cardiopulmonary disease.

## 2019-10-16 LAB — OB RESULTS CONSOLE RPR: RPR: NONREACTIVE

## 2019-10-16 LAB — OB RESULTS CONSOLE ABO/RH: RH Type: POSITIVE

## 2019-10-16 LAB — OB RESULTS CONSOLE GC/CHLAMYDIA
Chlamydia: NEGATIVE
Gonorrhea: NEGATIVE

## 2019-10-16 LAB — OB RESULTS CONSOLE ANTIBODY SCREEN: Antibody Screen: NEGATIVE

## 2019-10-16 LAB — OB RESULTS CONSOLE RUBELLA ANTIBODY, IGM: Rubella: IMMUNE

## 2019-10-16 LAB — OB RESULTS CONSOLE HEPATITIS B SURFACE ANTIGEN: Hepatitis B Surface Ag: NEGATIVE

## 2019-10-16 LAB — OB RESULTS CONSOLE HIV ANTIBODY (ROUTINE TESTING): HIV: NONREACTIVE

## 2020-02-18 LAB — OB RESULTS CONSOLE HIV ANTIBODY (ROUTINE TESTING): HIV: NONREACTIVE

## 2020-04-20 LAB — OB RESULTS CONSOLE GBS: GBS: NEGATIVE

## 2020-05-05 ENCOUNTER — Encounter (HOSPITAL_COMMUNITY): Payer: Self-pay | Admitting: *Deleted

## 2020-05-05 ENCOUNTER — Telehealth (HOSPITAL_COMMUNITY): Payer: Self-pay | Admitting: *Deleted

## 2020-05-05 NOTE — Telephone Encounter (Signed)
Preadmission screen  

## 2020-05-06 ENCOUNTER — Telehealth (HOSPITAL_COMMUNITY): Payer: Self-pay | Admitting: *Deleted

## 2020-05-06 ENCOUNTER — Encounter (HOSPITAL_COMMUNITY): Payer: Self-pay | Admitting: *Deleted

## 2020-05-06 NOTE — Telephone Encounter (Signed)
Preadmission screen  

## 2020-05-08 ENCOUNTER — Other Ambulatory Visit (HOSPITAL_COMMUNITY)
Admission: RE | Admit: 2020-05-08 | Discharge: 2020-05-08 | Disposition: A | Discharge status: Home / Self Care | Payer: BC Managed Care – PPO | Source: Ambulatory Visit | Attending: Obstetrics and Gynecology | Admitting: Obstetrics and Gynecology

## 2020-05-08 DIAGNOSIS — Z01812 Encounter for preprocedural laboratory examination: Secondary | ICD-10-CM | POA: Insufficient documentation

## 2020-05-08 DIAGNOSIS — Z20822 Contact with and (suspected) exposure to covid-19: Secondary | ICD-10-CM | POA: Insufficient documentation

## 2020-05-08 LAB — SARS CORONAVIRUS 2 (TAT 6-24 HRS): SARS Coronavirus 2: NEGATIVE

## 2020-05-11 ENCOUNTER — Inpatient Hospital Stay (HOSPITAL_COMMUNITY): Payer: BC Managed Care – PPO

## 2020-05-11 ENCOUNTER — Inpatient Hospital Stay (HOSPITAL_COMMUNITY): Payer: BC Managed Care – PPO | Admitting: Anesthesiology

## 2020-05-11 ENCOUNTER — Encounter (HOSPITAL_COMMUNITY)
Admission: AD | Disposition: A | Discharge status: Home / Self Care | Payer: Self-pay | Source: Home / Self Care | Attending: Obstetrics and Gynecology

## 2020-05-11 ENCOUNTER — Encounter (HOSPITAL_COMMUNITY): Payer: Self-pay | Admitting: Obstetrics and Gynecology

## 2020-05-11 ENCOUNTER — Inpatient Hospital Stay (HOSPITAL_COMMUNITY)
Admission: AD | Admit: 2020-05-11 | Discharge: 2020-05-14 | DRG: 788 | Disposition: A | Discharge status: Home / Self Care | Payer: BC Managed Care – PPO | Attending: Obstetrics and Gynecology | Admitting: Obstetrics and Gynecology

## 2020-05-11 ENCOUNTER — Other Ambulatory Visit: Payer: Self-pay

## 2020-05-11 DIAGNOSIS — Z3A39 39 weeks gestation of pregnancy: Secondary | ICD-10-CM | POA: Diagnosis not present

## 2020-05-11 DIAGNOSIS — R32 Unspecified urinary incontinence: Secondary | ICD-10-CM | POA: Diagnosis not present

## 2020-05-11 DIAGNOSIS — Z01812 Encounter for preprocedural laboratory examination: Secondary | ICD-10-CM | POA: Diagnosis not present

## 2020-05-11 DIAGNOSIS — Z87891 Personal history of nicotine dependence: Secondary | ICD-10-CM | POA: Diagnosis not present

## 2020-05-11 DIAGNOSIS — Z20822 Contact with and (suspected) exposure to covid-19: Secondary | ICD-10-CM | POA: Diagnosis present

## 2020-05-11 DIAGNOSIS — O99893 Other specified diseases and conditions complicating puerperium: Secondary | ICD-10-CM | POA: Diagnosis not present

## 2020-05-11 DIAGNOSIS — O99214 Obesity complicating childbirth: Secondary | ICD-10-CM | POA: Diagnosis present

## 2020-05-11 DIAGNOSIS — R319 Hematuria, unspecified: Secondary | ICD-10-CM | POA: Diagnosis not present

## 2020-05-11 DIAGNOSIS — Z23 Encounter for immunization: Secondary | ICD-10-CM | POA: Diagnosis not present

## 2020-05-11 DIAGNOSIS — O34211 Maternal care for low transverse scar from previous cesarean delivery: Secondary | ICD-10-CM | POA: Diagnosis present

## 2020-05-11 DIAGNOSIS — Z3490 Encounter for supervision of normal pregnancy, unspecified, unspecified trimester: Secondary | ICD-10-CM | POA: Diagnosis present

## 2020-05-11 LAB — TYPE AND SCREEN
ABO/RH(D): A POS
Antibody Screen: NEGATIVE

## 2020-05-11 LAB — CBC
HCT: 35.7 % — ABNORMAL LOW (ref 36.0–46.0)
HCT: 38.7 % (ref 36.0–46.0)
HCT: 31.8 % — ABNORMAL LOW (ref 36.0–46.0)
Hemoglobin: 11.9 g/dL — ABNORMAL LOW (ref 12.0–15.0)
Hemoglobin: 12.6 g/dL (ref 12.0–15.0)
Hemoglobin: 10.5 g/dL — ABNORMAL LOW (ref 12.0–15.0)
MCH: 30.7 pg (ref 26.0–34.0)
MCH: 29.9 pg (ref 26.0–34.0)
MCH: 30.3 pg (ref 26.0–34.0)
MCHC: 33.3 g/dL (ref 30.0–36.0)
MCHC: 32.6 g/dL (ref 30.0–36.0)
MCHC: 33 g/dL (ref 30.0–36.0)
MCV: 92.2 fL (ref 80.0–100.0)
MCV: 91.7 fL (ref 80.0–100.0)
MCV: 91.9 fL (ref 80.0–100.0)
Platelets: 138 10*3/uL — ABNORMAL LOW (ref 150–400)
Platelets: 156 10*3/uL (ref 150–400)
Platelets: 136 10*3/uL — ABNORMAL LOW (ref 150–400)
RBC: 3.87 MIL/uL (ref 3.87–5.11)
RBC: 4.22 MIL/uL (ref 3.87–5.11)
RBC: 3.46 MIL/uL — ABNORMAL LOW (ref 3.87–5.11)
RDW: 13.7 % (ref 11.5–15.5)
RDW: 13.7 % (ref 11.5–15.5)
RDW: 13.9 % (ref 11.5–15.5)
WBC: 11 10*3/uL — ABNORMAL HIGH (ref 4.0–10.5)
WBC: 13.3 10*3/uL — ABNORMAL HIGH (ref 4.0–10.5)
WBC: 25.9 10*3/uL — ABNORMAL HIGH (ref 4.0–10.5)
nRBC: 0 % (ref 0.0–0.2)
nRBC: 0 % (ref 0.0–0.2)
nRBC: 0 % (ref 0.0–0.2)

## 2020-05-11 LAB — RPR: RPR Ser Ql: NONREACTIVE

## 2020-05-11 SURGERY — Surgical Case
Anesthesia: Epidural

## 2020-05-11 MED ORDER — PROMETHAZINE HCL 25 MG/ML IJ SOLN: 6.2500 mg | INTRAMUSCULAR | Status: DC | PRN

## 2020-05-11 MED ORDER — TERBUTALINE SULFATE 1 MG/ML IJ SOLN: 0.2500 mg | Freq: Once | INTRAMUSCULAR | Status: DC | PRN

## 2020-05-11 MED ORDER — NALOXONE HCL 4 MG/10ML IJ SOLN
1.0000 ug/kg/h | INTRAVENOUS | Status: DC | PRN
  Filled 2020-05-11: qty 5

## 2020-05-11 MED ORDER — MORPHINE SULFATE (PF) 0.5 MG/ML IJ SOLN
INTRAMUSCULAR | Status: DC | PRN
Start: 2020-05-11 — End: 2020-05-11
  Administered 2020-05-11: 3 mg via EPIDURAL

## 2020-05-11 MED ORDER — MENTHOL 3 MG MT LOZG: 1.0000 | LOZENGE | OROMUCOSAL | Status: DC | PRN

## 2020-05-11 MED ORDER — FENTANYL CITRATE (PF) 100 MCG/2ML IJ SOLN
INTRAMUSCULAR | Status: AC
  Filled 2020-05-11: qty 2

## 2020-05-11 MED ORDER — KETOROLAC TROMETHAMINE 30 MG/ML IJ SOLN: 30.0000 mg | Freq: Four times a day (QID) | INTRAMUSCULAR | Status: AC | PRN

## 2020-05-11 MED ORDER — ONDANSETRON HCL 4 MG/2ML IJ SOLN: 4.0000 mg | Freq: Three times a day (TID) | INTRAMUSCULAR | Status: DC | PRN

## 2020-05-11 MED ORDER — DEXMEDETOMIDINE (PRECEDEX) IN NS 20 MCG/5ML (4 MCG/ML) IV SYRINGE
PREFILLED_SYRINGE | INTRAVENOUS | Status: DC | PRN
  Administered 2020-05-11 (×2): 4 ug via INTRAVENOUS
  Administered 2020-05-11: 8 ug via INTRAVENOUS
  Administered 2020-05-11: 4 ug via INTRAVENOUS

## 2020-05-11 MED ORDER — OXYTOCIN BOLUS FROM INFUSION: 333.0000 mL | Freq: Once | INTRAVENOUS | Status: DC

## 2020-05-11 MED ORDER — SIMETHICONE 80 MG PO CHEW
80.0000 mg | CHEWABLE_TABLET | ORAL | Status: DC
  Administered 2020-05-11 – 2020-05-13 (×3): 80 mg via ORAL
  Filled 2020-05-11 (×3): qty 1

## 2020-05-11 MED ORDER — LACTATED RINGERS IV SOLN
500.0000 mL | Freq: Once | INTRAVENOUS | Status: AC
  Administered 2020-05-11: 500 mL via INTRAVENOUS

## 2020-05-11 MED ORDER — LIDOCAINE-EPINEPHRINE (PF) 2 %-1:200000 IJ SOLN
INTRAMUSCULAR | Status: DC | PRN
  Administered 2020-05-11: 10 mL via EPIDURAL
  Administered 2020-05-11: 3 mL via EPIDURAL

## 2020-05-11 MED ORDER — OXYTOCIN-SODIUM CHLORIDE 30-0.9 UT/500ML-% IV SOLN
INTRAVENOUS | Status: DC | PRN
  Administered 2020-05-11: 200 mL via INTRAVENOUS

## 2020-05-11 MED ORDER — ZOLPIDEM TARTRATE 5 MG PO TABS: 5.0000 mg | ORAL_TABLET | Freq: Every evening | ORAL | Status: DC | PRN

## 2020-05-11 MED ORDER — SOD CITRATE-CITRIC ACID 500-334 MG/5ML PO SOLN
30.0000 mL | ORAL | Status: DC | PRN
  Administered 2020-05-11 (×2): 30 mL via ORAL
  Filled 2020-05-11: qty 15

## 2020-05-11 MED ORDER — LACTATED RINGERS IV SOLN
500.0000 mL | INTRAVENOUS | Status: DC | PRN
  Administered 2020-05-11: 500 mL via INTRAVENOUS

## 2020-05-11 MED ORDER — SODIUM CHLORIDE 0.9 % IR SOLN: 200.0000 mL | Freq: Once | Status: DC

## 2020-05-11 MED ORDER — OXYCODONE-ACETAMINOPHEN 5-325 MG PO TABS: 1.0000 | ORAL_TABLET | ORAL | Status: DC | PRN

## 2020-05-11 MED ORDER — OXYTOCIN-SODIUM CHLORIDE 30-0.9 UT/500ML-% IV SOLN: 2.5000 [IU]/h | INTRAVENOUS | Status: DC

## 2020-05-11 MED ORDER — EPHEDRINE 5 MG/ML INJ: 10.0000 mg | INTRAVENOUS | Status: DC | PRN

## 2020-05-11 MED ORDER — SCOPOLAMINE 1 MG/3DAYS TD PT72
1.0000 | MEDICATED_PATCH | Freq: Once | TRANSDERMAL | Status: DC
  Administered 2020-05-11: 1.5 mg via TRANSDERMAL
  Filled 2020-05-11: qty 1

## 2020-05-11 MED ORDER — SIMETHICONE 80 MG PO CHEW: 80.0000 mg | CHEWABLE_TABLET | ORAL | Status: DC | PRN

## 2020-05-11 MED ORDER — COCONUT OIL OIL
1.0000 "application " | TOPICAL_OIL | Status: DC | PRN
  Administered 2020-05-12: 1 via TOPICAL

## 2020-05-11 MED ORDER — DIPHENHYDRAMINE HCL 25 MG PO CAPS: 25.0000 mg | ORAL_CAPSULE | Freq: Four times a day (QID) | ORAL | Status: DC | PRN

## 2020-05-11 MED ORDER — HYDROMORPHONE HCL 1 MG/ML IJ SOLN: 0.2500 mg | INTRAMUSCULAR | Status: DC | PRN

## 2020-05-11 MED ORDER — SODIUM CHLORIDE 0.9% FLUSH: 3.0000 mL | INTRAVENOUS | Status: DC | PRN

## 2020-05-11 MED ORDER — MEASLES, MUMPS & RUBELLA VAC IJ SOLR: 0.5000 mL | Freq: Once | INTRAMUSCULAR | Status: DC

## 2020-05-11 MED ORDER — DIPHENHYDRAMINE HCL 50 MG/ML IJ SOLN: 12.5000 mg | INTRAMUSCULAR | Status: DC | PRN

## 2020-05-11 MED ORDER — FENTANYL CITRATE (PF) 100 MCG/2ML IJ SOLN
INTRAMUSCULAR | Status: DC | PRN
  Administered 2020-05-11: 50 ug via INTRAVENOUS

## 2020-05-11 MED ORDER — ALBUTEROL SULFATE (2.5 MG/3ML) 0.083% IN NEBU: 3.0000 mL | INHALATION_SOLUTION | Freq: Four times a day (QID) | RESPIRATORY_TRACT | Status: DC | PRN

## 2020-05-11 MED ORDER — ACETAMINOPHEN 325 MG PO TABS
650.0000 mg | ORAL_TABLET | ORAL | Status: DC | PRN
  Administered 2020-05-13 – 2020-05-14 (×3): 650 mg via ORAL
  Filled 2020-05-11 (×3): qty 2

## 2020-05-11 MED ORDER — DIBUCAINE (PERIANAL) 1 % EX OINT: 1.0000 "application " | TOPICAL_OINTMENT | CUTANEOUS | Status: DC | PRN

## 2020-05-11 MED ORDER — KETOROLAC TROMETHAMINE 30 MG/ML IJ SOLN
30.0000 mg | Freq: Four times a day (QID) | INTRAMUSCULAR | Status: AC | PRN
  Administered 2020-05-12: 30 mg via INTRAVENOUS
  Filled 2020-05-11: qty 1

## 2020-05-11 MED ORDER — OXYCODONE-ACETAMINOPHEN 5-325 MG PO TABS: 2.0000 | ORAL_TABLET | ORAL | Status: DC | PRN

## 2020-05-11 MED ORDER — DEXAMETHASONE SODIUM PHOSPHATE 10 MG/ML IJ SOLN
INTRAMUSCULAR | Status: AC
  Filled 2020-05-11: qty 1

## 2020-05-11 MED ORDER — ONDANSETRON HCL 4 MG/2ML IJ SOLN
4.0000 mg | Freq: Four times a day (QID) | INTRAMUSCULAR | Status: DC | PRN
  Administered 2020-05-11: 4 mg via INTRAVENOUS
  Filled 2020-05-11: qty 2

## 2020-05-11 MED ORDER — LACTATED RINGERS IV SOLN: INTRAVENOUS | Status: DC

## 2020-05-11 MED ORDER — OXYCODONE HCL 5 MG PO TABS: 5.0000 mg | ORAL_TABLET | Freq: Once | ORAL | Status: DC | PRN

## 2020-05-11 MED ORDER — NALOXONE HCL 0.4 MG/ML IJ SOLN: 0.4000 mg | INTRAMUSCULAR | Status: DC | PRN

## 2020-05-11 MED ORDER — MORPHINE SULFATE (PF) 0.5 MG/ML IJ SOLN
INTRAMUSCULAR | Status: AC
  Filled 2020-05-11: qty 10

## 2020-05-11 MED ORDER — SIMETHICONE 80 MG PO CHEW
80.0000 mg | CHEWABLE_TABLET | Freq: Three times a day (TID) | ORAL | Status: DC
  Administered 2020-05-12 – 2020-05-14 (×7): 80 mg via ORAL
  Filled 2020-05-11 (×7): qty 1

## 2020-05-11 MED ORDER — NALBUPHINE HCL 10 MG/ML IJ SOLN: 5.0000 mg | INTRAMUSCULAR | Status: DC | PRN

## 2020-05-11 MED ORDER — MEPERIDINE HCL 25 MG/ML IJ SOLN: 6.2500 mg | INTRAMUSCULAR | Status: DC | PRN

## 2020-05-11 MED ORDER — ONDANSETRON HCL 4 MG/2ML IJ SOLN
INTRAMUSCULAR | Status: DC | PRN
  Administered 2020-05-11: 4 mg via INTRAVENOUS

## 2020-05-11 MED ORDER — WITCH HAZEL-GLYCERIN EX PADS: 1.0000 "application " | MEDICATED_PAD | CUTANEOUS | Status: DC | PRN

## 2020-05-11 MED ORDER — MEPERIDINE HCL 25 MG/ML IJ SOLN
INTRAMUSCULAR | Status: DC | PRN
  Administered 2020-05-11 (×2): 12.5 mg via INTRAVENOUS

## 2020-05-11 MED ORDER — PHENYLEPHRINE 40 MCG/ML (10ML) SYRINGE FOR IV PUSH (FOR BLOOD PRESSURE SUPPORT): 80.0000 ug | PREFILLED_SYRINGE | INTRAVENOUS | Status: DC | PRN

## 2020-05-11 MED ORDER — DEXAMETHASONE SODIUM PHOSPHATE 10 MG/ML IJ SOLN
INTRAMUSCULAR | Status: DC | PRN
  Administered 2020-05-11: 10 mg via INTRAVENOUS

## 2020-05-11 MED ORDER — NALBUPHINE HCL 10 MG/ML IJ SOLN: 5.0000 mg | Freq: Once | INTRAMUSCULAR | Status: DC | PRN

## 2020-05-11 MED ORDER — BUTORPHANOL TARTRATE 1 MG/ML IJ SOLN
1.0000 mg | INTRAMUSCULAR | Status: DC | PRN
  Administered 2020-05-11: 1 mg via INTRAVENOUS
  Filled 2020-05-11: qty 1

## 2020-05-11 MED ORDER — MEPERIDINE HCL 25 MG/ML IJ SOLN
INTRAMUSCULAR | Status: AC
  Filled 2020-05-11: qty 1

## 2020-05-11 MED ORDER — KETOROLAC TROMETHAMINE 30 MG/ML IJ SOLN
INTRAMUSCULAR | Status: AC
  Filled 2020-05-11: qty 1

## 2020-05-11 MED ORDER — FENTANYL CITRATE (PF) 100 MCG/2ML IJ SOLN
INTRAMUSCULAR | Status: DC | PRN
Start: 2020-05-11 — End: 2020-05-11
  Administered 2020-05-11: 100 ug via EPIDURAL
  Administered 2020-05-11: 50 ug via EPIDURAL

## 2020-05-11 MED ORDER — SODIUM CHLORIDE 0.9 % IV SOLN
2.0000 g | Freq: Once | INTRAVENOUS | Status: AC
  Administered 2020-05-11: 2 g via INTRAVENOUS
  Filled 2020-05-11 (×2): qty 2

## 2020-05-11 MED ORDER — PRENATAL MULTIVITAMIN CH
1.0000 | ORAL_TABLET | Freq: Every day | ORAL | Status: DC
  Administered 2020-05-12 – 2020-05-14 (×3): 1 via ORAL
  Filled 2020-05-11 (×3): qty 1

## 2020-05-11 MED ORDER — KETOROLAC TROMETHAMINE 30 MG/ML IJ SOLN
30.0000 mg | Freq: Once | INTRAMUSCULAR | Status: AC | PRN
  Administered 2020-05-11: 30 mg via INTRAVENOUS

## 2020-05-11 MED ORDER — IBUPROFEN 600 MG PO TABS: 600.0000 mg | ORAL_TABLET | Freq: Four times a day (QID) | ORAL | Status: DC | PRN

## 2020-05-11 MED ORDER — LIDOCAINE HCL (PF) 1 % IJ SOLN
INTRAMUSCULAR | Status: DC | PRN
  Administered 2020-05-11: 5 mL via EPIDURAL
  Administered 2020-05-11: 8 mL via EPIDURAL

## 2020-05-11 MED ORDER — DEXMEDETOMIDINE (PRECEDEX) IN NS 20 MCG/5ML (4 MCG/ML) IV SYRINGE
PREFILLED_SYRINGE | INTRAVENOUS | Status: AC
  Filled 2020-05-11: qty 5

## 2020-05-11 MED ORDER — DIPHENHYDRAMINE HCL 25 MG PO CAPS: 25.0000 mg | ORAL_CAPSULE | ORAL | Status: DC | PRN

## 2020-05-11 MED ORDER — ACETAMINOPHEN 500 MG PO TABS
1000.0000 mg | ORAL_TABLET | Freq: Four times a day (QID) | ORAL | Status: AC
  Administered 2020-05-11 – 2020-05-12 (×4): 1000 mg via ORAL
  Filled 2020-05-11 (×4): qty 2

## 2020-05-11 MED ORDER — ACETAMINOPHEN 325 MG PO TABS: 650.0000 mg | ORAL_TABLET | ORAL | Status: DC | PRN

## 2020-05-11 MED ORDER — OXYTOCIN-SODIUM CHLORIDE 30-0.9 UT/500ML-% IV SOLN: 2.5000 [IU]/h | INTRAVENOUS | Status: AC

## 2020-05-11 MED ORDER — SENNOSIDES-DOCUSATE SODIUM 8.6-50 MG PO TABS
2.0000 | ORAL_TABLET | ORAL | Status: DC
  Administered 2020-05-11 – 2020-05-13 (×3): 2 via ORAL
  Filled 2020-05-11 (×3): qty 2

## 2020-05-11 MED ORDER — OXYTOCIN-SODIUM CHLORIDE 30-0.9 UT/500ML-% IV SOLN
1.0000 m[IU]/min | INTRAVENOUS | Status: DC
  Administered 2020-05-11: 8 m[IU]/min via INTRAVENOUS
  Administered 2020-05-11: 2 m[IU]/min via INTRAVENOUS
  Filled 2020-05-11: qty 500

## 2020-05-11 MED ORDER — BUPIVACAINE HCL (PF) 0.75 % IJ SOLN
INTRAMUSCULAR | Status: DC | PRN
Start: 2020-05-11 — End: 2020-05-11
  Administered 2020-05-11: 12 mL/h via EPIDURAL

## 2020-05-11 MED ORDER — PHENYLEPHRINE 40 MCG/ML (10ML) SYRINGE FOR IV PUSH (FOR BLOOD PRESSURE SUPPORT)
80.0000 ug | PREFILLED_SYRINGE | INTRAVENOUS | Status: DC | PRN
  Filled 2020-05-11: qty 10

## 2020-05-11 MED ORDER — OXYCODONE HCL 5 MG/5ML PO SOLN: 5.0000 mg | Freq: Once | ORAL | Status: DC | PRN

## 2020-05-11 MED ORDER — FENTANYL-BUPIVACAINE-NACL 0.5-0.125-0.9 MG/250ML-% EP SOLN
12.0000 mL/h | EPIDURAL | Status: DC | PRN
  Filled 2020-05-11: qty 250

## 2020-05-11 MED ORDER — TETANUS-DIPHTH-ACELL PERTUSSIS 5-2.5-18.5 LF-MCG/0.5 IM SUSP: 0.5000 mL | Freq: Once | INTRAMUSCULAR | Status: DC

## 2020-05-11 MED ORDER — LIDOCAINE HCL (PF) 1 % IJ SOLN: 30.0000 mL | INTRAMUSCULAR | Status: DC | PRN

## 2020-05-11 SURGICAL SUPPLY — 31 items
CHLORAPREP W/TINT 26ML (MISCELLANEOUS) ×3 IMPLANT
CLAMP CORD UMBIL (MISCELLANEOUS) IMPLANT
CLOTH BEACON ORANGE TIMEOUT ST (SAFETY) ×3 IMPLANT
DRSG OPSITE POSTOP 4X10 (GAUZE/BANDAGES/DRESSINGS) ×3 IMPLANT
ELECT REM PT RETURN 9FT ADLT (ELECTROSURGICAL) ×3
ELECTRODE REM PT RTRN 9FT ADLT (ELECTROSURGICAL) ×1 IMPLANT
EXTRACTOR VACUUM M CUP 4 TUBE (SUCTIONS) IMPLANT
EXTRACTOR VACUUM M CUP 4' TUBE (SUCTIONS)
GAUZE SPONGE 4X4 12PLY STRL LF (GAUZE/BANDAGES/DRESSINGS) ×4 IMPLANT
GLOVE BIOGEL PI IND STRL 7.0 (GLOVE) ×1 IMPLANT
GLOVE BIOGEL PI INDICATOR 7.0 (GLOVE) ×2
GLOVE SURG ORTHO 8.0 STRL STRW (GLOVE) ×3 IMPLANT
GOWN STRL REUS W/TWL LRG LVL3 (GOWN DISPOSABLE) ×6 IMPLANT
KIT ABG SYR 3ML LUER SLIP (SYRINGE) ×3 IMPLANT
NDL HYPO 25X5/8 SAFETYGLIDE (NEEDLE) ×1 IMPLANT
NEEDLE HYPO 25X5/8 SAFETYGLIDE (NEEDLE) ×3 IMPLANT
NS IRRIG 1000ML POUR BTL (IV SOLUTION) ×3 IMPLANT
PACK C SECTION WH (CUSTOM PROCEDURE TRAY) ×3 IMPLANT
PAD ABD 7.5X8 STRL (GAUZE/BANDAGES/DRESSINGS) ×4 IMPLANT
PAD OB MATERNITY 4.3X12.25 (PERSONAL CARE ITEMS) ×3 IMPLANT
PENCIL SMOKE EVAC W/HOLSTER (ELECTROSURGICAL) ×3 IMPLANT
SUT MNCRL 0 VIOLET CTX 36 (SUTURE) ×3 IMPLANT
SUT MON AB 4-0 PS1 27 (SUTURE) ×3 IMPLANT
SUT MONOCRYL 0 CTX 36 (SUTURE) ×9
SUT PDS AB 1 CT  36 (SUTURE)
SUT PDS AB 1 CT 36 (SUTURE) IMPLANT
SUT VIC AB 1 CTX 36 (SUTURE)
SUT VIC AB 1 CTX36XBRD ANBCTRL (SUTURE) IMPLANT
TOWEL OR 17X24 6PK STRL BLUE (TOWEL DISPOSABLE) ×3 IMPLANT
TRAY FOLEY W/BAG SLVR 14FR LF (SET/KITS/TRAYS/PACK) ×3 IMPLANT
WATER STERILE IRR 1000ML POUR (IV SOLUTION) ×3 IMPLANT

## 2020-05-11 NOTE — H&P (Signed)
Lisa Powers is a 29 y.o. female presenting for IOL due to term, desires elective IOL with hx of previous LSTCS for arrest of descent.  Pregnancy otherwise uncomplicated.  GBS and EFW last week 7# by [place].. OB History    Gravida  2   Para  1   Term  1   Preterm      AB      Living  1     SAB      TAB      Ectopic      Multiple  0   Live Births  1          Past Medical History:  Diagnosis Date  . GERD (gastroesophageal reflux disease)    occasional, no meds, diet controlled  . History of gestational hypertension    resoloved after c/s delivery  . Pilonidal disease    W/ RECURRENT INFECTION  . Postpartum anxiety 2019  . Postpartum depression 2019  . Seasonal asthma   . Wears contact lenses    Past Surgical History:  Procedure Laterality Date  . CESAREAN SECTION N/A 10/16/2017   Procedure: CESAREAN SECTION;  Surgeon: Harold Hedge, MD;  Location: [facility] BIRTHING SUITES;  Service: Obstetrics;  Laterality: N/A;  . DILATION AND EVACUATION N/A 06/10/2019   Procedure: DILATATION AND EVACUATION;  Surgeon: Mitchel Honour, DO;  Location: [facility] OR;  Service: Gynecology;  Laterality: N/A;  . PILONIDAL CYST EXCISION N/A 01/30/2018   Procedure: EXCISION PILONIDAL DISEASE EXTENSIVE ERAS PATHWAY;  Surgeon: Karie Soda, MD;  Location: [facility];  Service: General;  Laterality: N/A;  . WISDOM TOOTH EXTRACTION     Family History: family history includes Heart disease in her maternal grandmother; Hyperlipidemia in her father and mother. Social History:  reports that she quit smoking about 17 months ago. Her smoking use included cigarettes. She smoked 0.25 packs per day. She has never used smokeless tobacco. She reports that she does not drink alcohol and does not use drugs.     Maternal Diabetes: No Genetic Screening: Normal Maternal Ultrasounds/Referrals: Normal Fetal Ultrasounds or other Referrals:  None Maternal Substance Abuse:  No Significant Maternal  Medications:  None Significant Maternal Lab Results:  Group B Strep negative Other Comments:  None  Review of Systems History Dilation: 3 Effacement (%): 90 Station: -2 Exam by:: Dr Rana Snare Blood pressure (!) 152/82, pulse (!) 57, temperature 97.8 F (36.6 C), temperature source Oral, resp. rate 18, height 5\' 4"  (1.626 m), weight 115.2 kg, SpO2 100 %, currently breastfeeding. Exam Physical Exam  Prenatal labs: ABO, Rh: --/--/A POS (09/21 0110) Antibody: NEG (09/21 0110) Rubella: Immune (02/25 0000) RPR: Nonreactive (02/25 0000)  HBsAg: Negative (02/25 0000)  HIV: Non-reactive (06/30 0000)  GBS: Negative/-- (08/31 0000)   Assessment/Plan: IUP at term Previous c/s desires TOLAC.  R&B for TOLAC discussed at length and informed consent obtained. Pit/AROM today.  Anticipate SVD   10-02-1976 05/11/2020, 9:00 AM

## 2020-05-11 NOTE — Progress Notes (Addendum)
Patient ID: [name], female   DOB: 07/19/1991, 29 y.o.   MRN: [id number] Lisa Powers has been pushing for 3-4 hours and is now exhausted. Fetal vertex has significant molding and caput and O station and LOT presentation. FHR 150s with accels and mild variables with ctxs.  I recommend Repeat c/s for arrest of descent.  Risks and benefits of C/S were discussed.  All questions were answered and informed consent was obtained.  Plan to proceed with low segment transverse Cesarean Section.

## 2020-05-11 NOTE — Op Note (Signed)
Cesarean Section Procedure Note  Pre-operative Diagnosis: IUP at 39 weeks, previous C/S, Attempted TOLAC, Arrest of descent  Post-operative Diagnosis: same  Surgeon: Turner Daniels   Assistants:    Anesthesia: epidural  Procedure:  Low Segment Transverse cesarean section  Procedure Details  The patient was seen in the Holding Room. The risks, benefits, complications, treatment options, and expected outcomes were discussed with the patient.  The patient concurred with the proposed plan, giving informed consent.  The site of surgery properly noted/marked.. A Time Out was held and the above information confirmed.  Hematuria noted in foley bag before the procedure.  After induction of anesthesia, the patient was draped and prepped in the usual sterile manner. A Pfannenstiel incision was made and carried down through the subcutaneous tissue to the fascia. Fascial incision was made and extended transversely. The fascia was separated from the underlying rectus tissue superiorly and inferiorly. The peritoneum was identified and entered. Peritoneal incision was extended longitudinally. The utero-vesical peritoneal reflection was incised transversely and the bladder flap was bluntly freed from the lower uterine segment. A moderate amount of edema and around the lower segment.   A low transverse uterine incision was made. Delivered from vertex LOT presentation was a baby with Apgar scores of 9 at one minute and 9 at five minutes. After the umbilical cord was clamped and cut cord blood was obtained for evaluation. The placenta was removed intact and appeared normal. The uterine outline, tubes and ovaries appeared normal. The uterine incision was closed with running locked sutures of 0 monocryl and imbricated with 0 monocryl. Hemostasis was observed. Lavage was carried out until clear. The peritoneum was then closed with 0 monocryl and rectus muscles plicated in the midline.  After hemostasis was assured, the  fascia was then reapproximated with running sutures of 0 PDS. Irrigation was applied and after adequate hemostasis was assured, the skin was reapproximated with subcutaneous sutures using 4-0 monocryl.  Instrument, sponge, and needle counts were correct prior the abdominal closure and at the conclusion of the case. The patient received 2 grams cefotetan preoperatively.  Findings: Viable female  Estimated Blood Loss:  875cc         Specimens: Placenta was sent to labor and delivery         Complications:  None

## 2020-05-11 NOTE — Anesthesia Procedure Notes (Signed)
Epidural Patient location during procedure: OB Start time: 05/11/2020 9:16 AM End time: 05/11/2020 9:21 AM  Staffing Anesthesiologist: Leilani Able, MD Performed: anesthesiologist   Preanesthetic Checklist Completed: patient identified, IV checked, site marked, risks and benefits discussed, surgical consent, monitors and equipment checked, pre-op evaluation and timeout performed  Epidural Patient position: sitting Prep: DuraPrep and site prepped and draped Patient monitoring: continuous pulse ox and blood pressure Approach: midline Location: L3-L4 Injection technique: LOR air  Needle:  Needle type: Tuohy  Needle gauge: 17 G Needle length: 9 cm and 9 Needle insertion depth: 7 cm Catheter type: closed end flexible Catheter size: 19 Gauge Catheter at skin depth: 12 cm Test dose: negative and Other  Assessment Events: blood not aspirated, injection not painful, no injection resistance, no paresthesia and negative IV test  Additional Notes Reason for block:procedure for pain

## 2020-05-11 NOTE — Transfer of Care (Signed)
Immediate Anesthesia Transfer of Care Note  Patient: Lisa Powers  Procedure(s) Performed: CESAREAN SECTION (N/A )  Patient Location: PACU  Anesthesia Type:Epidural  Level of Consciousness: awake, alert , oriented and patient cooperative  Airway & Oxygen Therapy: Patient Spontanous Breathing  Post-op Assessment: Report given to RN and Post -op Vital signs reviewed and stable  Post vital signs: Reviewed and stable  Last Vitals:  Vitals Value Taken Time  BP 133/89 05/11/20 1925  Temp    Pulse 100 05/11/20 1928  Resp 11 05/11/20 1928  SpO2 100 % 05/11/20 1928  Vitals shown include unvalidated device data.  Last Pain:  Vitals:   05/11/20 1606  TempSrc: Axillary  PainSc:          Complications: No complications documented.

## 2020-05-11 NOTE — Anesthesia Preprocedure Evaluation (Signed)
Anesthesia Evaluation  Patient identified by MRN, date of birth, ID band Patient awake    Reviewed: Allergy & Precautions, Patient's Chart, lab work & pertinent test results  Airway Mallampati: III  TM Distance: >3 FB     Dental no notable dental hx. (+) Teeth Intact   Pulmonary asthma , Current Smoker and Patient abstained from smoking., former smoker,    Pulmonary exam normal        Cardiovascular hypertension (PIH), Normal cardiovascular exam     Neuro/Psych PSYCHIATRIC DISORDERS Anxiety Depression negative neurological ROS     GI/Hepatic Neg liver ROS,   Endo/Other  Morbid obesity  Renal/GU negative Renal ROS     Musculoskeletal negative musculoskeletal ROS (+)   Abdominal (+) + obese,   Peds  Hematology negative hematology ROS (+)   Anesthesia Other Findings   Reproductive/Obstetrics (+) Pregnancy (IOL 2/2 PIH)                             Lab Results  Component Value Date   WBC 13.3 (H) 05/11/2020   HGB 12.6 05/11/2020   HCT 38.7 05/11/2020   MCV 91.7 05/11/2020   PLT 156 05/11/2020   Lab Results  Component Value Date   CREATININE 1.01 (H) 10/15/2017   BUN 6 10/15/2017   NA 133 (L) 10/15/2017   K 4.1 10/15/2017   CL 102 10/15/2017   CO2 21 (L) 10/15/2017    Anesthesia Physical  Anesthesia Plan  ASA: III  Anesthesia Plan: Epidural   Post-op Pain Management:    Induction:   PONV Risk Score and Plan:   Airway Management Planned:   Additional Equipment:   Intra-op Plan:   Post-operative Plan:   Informed Consent: I have reviewed the patients History and Physical, chart, labs and discussed the procedure including the risks, benefits and alternatives for the proposed anesthesia with the patient or authorized representative who has indicated his/her understanding and acceptance.       Plan Discussed with:   Anesthesia Plan Comments:          Anesthesia Quick Evaluation

## 2020-05-12 ENCOUNTER — Encounter (HOSPITAL_COMMUNITY): Payer: Self-pay | Admitting: Obstetrics and Gynecology

## 2020-05-12 LAB — CBC
HCT: 27.4 % — ABNORMAL LOW (ref 36.0–46.0)
Hemoglobin: 8.9 g/dL — ABNORMAL LOW (ref 12.0–15.0)
MCH: 30.1 pg (ref 26.0–34.0)
MCHC: 32.5 g/dL (ref 30.0–36.0)
MCV: 92.6 fL (ref 80.0–100.0)
Platelets: 123 10*3/uL — ABNORMAL LOW (ref 150–400)
RBC: 2.96 MIL/uL — ABNORMAL LOW (ref 3.87–5.11)
RDW: 13.8 % (ref 11.5–15.5)
WBC: 26 10*3/uL — ABNORMAL HIGH (ref 4.0–10.5)
nRBC: 0 % (ref 0.0–0.2)

## 2020-05-12 MED ORDER — OXYCODONE HCL 5 MG PO TABS
5.0000 mg | ORAL_TABLET | ORAL | Status: DC | PRN
  Administered 2020-05-12: 5 mg via ORAL
  Filled 2020-05-12: qty 1

## 2020-05-12 MED ORDER — INFLUENZA VAC SPLIT QUAD 0.5 ML IM SUSY
0.5000 mL | PREFILLED_SYRINGE | INTRAMUSCULAR | Status: AC
  Administered 2020-05-13: 0.5 mL via INTRAMUSCULAR
  Filled 2020-05-12: qty 0.5

## 2020-05-12 MED ORDER — OXYCODONE HCL 5 MG PO TABS
10.0000 mg | ORAL_TABLET | ORAL | Status: DC | PRN
  Administered 2020-05-13 – 2020-05-14 (×3): 10 mg via ORAL
  Filled 2020-05-12 (×3): qty 2

## 2020-05-12 MED ORDER — OXYCODONE-ACETAMINOPHEN 5-325 MG PO TABS: 1.0000 | ORAL_TABLET | ORAL | Status: DC | PRN

## 2020-05-12 MED ORDER — IBUPROFEN 600 MG PO TABS
600.0000 mg | ORAL_TABLET | Freq: Four times a day (QID) | ORAL | Status: DC | PRN
  Administered 2020-05-13 – 2020-05-14 (×4): 600 mg via ORAL
  Filled 2020-05-12 (×4): qty 1

## 2020-05-12 NOTE — Social Work (Addendum)
CSW received consult for hx of Anxiety and Depression.  CSW spoke with MOB to offer support and complete assessment.    CSW introduced self and role. CSW congratulated MOB and asked how she is feeling. MOB expressed she is feeling good. CSW informed MOB of reason for consult being Edinburgh score and anxiety history. MOB expressed understanding. CSW asked MOB about her mental health history. 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 MOB stated she received therapy at the [facility]. MOB expressed she found therapy to be helpful and that she is now more aware and knows what to look for in reference to symptoms. CSW asked MOB stated she received medication for the anxiety, but is unable to recall the medication name. MOB stated she has been off the medication for about a year and a half. CSW asked MOB if she experienced any depression, MOB stated no. MOB stated she only experienced some baby blues. MOB denied any additional mental health diagnosis. MOB stated she has not experienced any SI or HI. MOB denies being involved in any DV. MOB identified her husband and parents as supports. CSW asked MOB how she is feeling overall at this time. MOB stated she is feeling okay. Just experiencing some normal anxiety, wanting to constantly check on baby.    CSW provided education regarding the baby blues period vs. perinatal mood disorders, discussed treatment and gave resources for mental health follow up if concerns arise.  CSW recommends self-evaluation during the postpartum time period using the New Mom Checklist from Postpartum Progress and encouraged MOB to contact a medical professional if symptoms are noted at any time.    CSW provided review of Sudden Infant Death Syndrome (SIDS) precautions. MOB stated baby will sleep in a bedside basinet once discharged home.   MOB stated she has all essential needs for baby, including a brand new carseat. MOB will received  follow-up care at [facility]. MOB denied any transportation barriers.   CSW identifies no further need for intervention and no barriers to discharge at this time.  Manfred Arch, LCSWA Clinical Social Worker [facility]

## 2020-05-12 NOTE — Progress Notes (Signed)
Called Dr. Rana Snare to give update on minimal urine output in foley catheter over last two hours and continuing red colored urine that has not cleared since patient got to floor. Dr. Rana Snare gave verbal order to irrigate foley catheter and call back if output does not improve. Irrigated catheter with 50 mL sterile water, 200 mL of red urine collected in foley bag over next 5 minutes. Dr. Rana Snare states he is fine with the red color of the urine. This is the second time the catheter has been irrigated. Catheter was irrigated once before by PACU nurse, per order, before transfer to mother baby. Mother baby RN was informed by PACU RN that Dr. Rana Snare was also made aware of urine color while patient in PACU. Will continue to monitor output amounts and color.

## 2020-05-12 NOTE — Anesthesia Postprocedure Evaluation (Signed)
Anesthesia Post Note  Patient: DELORSE SHANE  Procedure(s) Performed: CESAREAN SECTION (N/A )     Patient location during evaluation: PACU Anesthesia Type: Epidural Level of consciousness: oriented and awake and alert Pain management: pain level controlled Vital Signs Assessment: post-procedure vital signs reviewed and stable Respiratory status: spontaneous breathing and respiratory function stable Cardiovascular status: blood pressure returned to baseline and stable Postop Assessment: no headache, no backache, no apparent nausea or vomiting, patient able to bend at knees and epidural receding Anesthetic complications: no   No complications documented.  Last Vitals:  Vitals:   05/11/20 2315 05/12/20 0020  BP: 133/83   Pulse: 92   Resp: 16 16  Temp: 37.1 C 37.1 C  SpO2: 97% 97%    Last Pain:  Vitals:   05/12/20 0020  TempSrc:   PainSc: 2    Pain Goal:                Epidural/Spinal Function Cutaneous sensation: Normal sensation (05/12/20 0020), Patient able to flex knees: Yes (05/12/20 0020), Patient able to lift hips off bed: Yes (05/12/20 0020), Back pain beyond tenderness at insertion site: No (05/12/20 0020), Progressively worsening motor and/or sensory loss: No (05/12/20 0020), Bowel and/or bladder incontinence post epidural: No (05/12/20 0020)  Lannie Fields

## 2020-05-12 NOTE — Progress Notes (Signed)
POD # 1  S:  Yesterday, patient pushed x 4 hours and then went for C Section.   Bright red blood noted in catheter BEFORE C Section and while pushing. Overnight, the Foley was flushed of clots and this morning it is still red but appears to be clearing slightly and her husband acknowledges that.  She is very sore .  O: BP 111/72 (BP Location: Left Arm)   Pulse 70   Temp 98.5 F (36.9 C) (Oral)   Resp 16   Ht 5\' 4"  (1.626 m)   Wt 115.2 kg   SpO2 97%   Breastfeeding Unknown   BMI 43.60 kg/m  Results for orders placed or performed during the hospital encounter of 05/11/20 (from the past 24 hour(s))  CBC     Status: Abnormal   Collection Time: 05/11/20  8:08 PM  Result Value Ref Range   WBC 25.9 (H) 4.0 - 10.5 K/uL   RBC 3.46 (L) 3.87 - 5.11 MIL/uL   Hemoglobin 10.5 (L) 12.0 - 15.0 g/dL   HCT 05/13/20 (L) 36 - 46 %   MCV 91.9 80.0 - 100.0 fL   MCH 30.3 26.0 - 34.0 pg   MCHC 33.0 30.0 - 36.0 g/dL   RDW 42.7 06.2 - 37.6 %   Platelets 136 (L) 150 - 400 K/uL   nRBC 0.0 0.0 - 0.2 %  CBC     Status: Abnormal   Collection Time: 05/12/20  5:20 AM  Result Value Ref Range   WBC 26.0 (H) 4.0 - 10.5 K/uL   RBC 2.96 (L) 3.87 - 5.11 MIL/uL   Hemoglobin 8.9 (L) 12.0 - 15.0 g/dL   HCT 05/14/20 (L) 36 - 46 %   MCV 92.6 80.0 - 100.0 fL   MCH 30.1 26.0 - 34.0 pg   MCHC 32.5 30.0 - 36.0 g/dL   RDW 15.1 76.1 - 60.7 %   Platelets 123 (L) 150 - 400 K/uL   nRBC 0.0 0.0 - 0.2 %   Abdomen dressing clean and dry   Foley  Red tinged urine - normal amount in catheter  No clots seen  POD # 1  Doing well Flush catheter again - keep FOLEY in for one more day  Discussed with patient and all questions are answered

## 2020-05-12 NOTE — Progress Notes (Signed)
Notified Dr. Vincente Poli of patient leaking urine out around her foley onto her peripad and decreased output in her foley.Orders to discontinue foley. Foley discontinued at 1900, there was a small dark clot at the end of the catheter tubing. Patient started to void on her own 200 ml outpt.

## 2020-05-12 NOTE — Lactation Note (Signed)
This note was copied from a baby's chart. Lactation Consultation Note  Patient Name: Lisa Powers [id number] Date: 05/12/2020 Reason for consult: Follow-up assessment   P2, Mother states she had challenges with first child who had lip and tongue tie and she had milk supply challenges. She wants to be proactive this time and has started pumping already.  She has expressed colostrum. Demonstrated how to spoon feed infant. Mother then hand expressed and latched baby in cradle hold with intermittent swallows and lips flanged. Encouraged mother to continue post pumping and give volume back to baby. Mom made aware of O/P services, breastfeeding support groups, community resources, and our phone # for post-discharge questions.  Feed on demand with cues.  Goal 8-12+ times per day after first 24 hrs.  Place baby STS if not cueing.     Maternal Data Has patient been taught Hand Expression?: Yes Does the patient have breastfeeding experience prior to this delivery?: Yes  Feeding Feeding Type: Breast Fed  LATCH Score Latch: Grasps breast easily, tongue down, lips flanged, rhythmical sucking.  Audible Swallowing: A few with stimulation  Type of Nipple: Everted at rest and after stimulation  Comfort (Breast/Nipple): Soft / non-tender  Hold (Positioning): Assistance needed to correctly position infant at breast and maintain latch.  LATCH Score: 8  Interventions Interventions: Breast feeding basics reviewed;Assisted with latch;Skin to skin;Hand express;DEBP  Lactation Tools Discussed/Used     Consult Status Consult Status: Follow-up Date: 05/13/20 Follow-up type: In-patient    Dahlia Byes [facility] 05/12/2020, 12:37 PM

## 2020-05-13 LAB — CBC WITH DIFFERENTIAL/PLATELET
Abs Immature Granulocytes: 0.08 10*3/uL — ABNORMAL HIGH (ref 0.00–0.07)
Basophils Absolute: 0 10*3/uL (ref 0.0–0.1)
Basophils Relative: 0 %
Eosinophils Absolute: 0.1 10*3/uL (ref 0.0–0.5)
Eosinophils Relative: 1 %
HCT: 27 % — ABNORMAL LOW (ref 36.0–46.0)
Hemoglobin: 8.8 g/dL — ABNORMAL LOW (ref 12.0–15.0)
Immature Granulocytes: 0 %
Lymphocytes Relative: 15 %
Lymphs Abs: 2.6 10*3/uL (ref 0.7–4.0)
MCH: 30.3 pg (ref 26.0–34.0)
MCHC: 32.6 g/dL (ref 30.0–36.0)
MCV: 93.1 fL (ref 80.0–100.0)
Monocytes Absolute: 1.1 10*3/uL — ABNORMAL HIGH (ref 0.1–1.0)
Monocytes Relative: 6 %
Neutro Abs: 13.8 10*3/uL — ABNORMAL HIGH (ref 1.7–7.7)
Neutrophils Relative %: 78 %
Platelets: 137 10*3/uL — ABNORMAL LOW (ref 150–400)
RBC: 2.9 MIL/uL — ABNORMAL LOW (ref 3.87–5.11)
RDW: 14.5 % (ref 11.5–15.5)
WBC: 17.8 10*3/uL — ABNORMAL HIGH (ref 4.0–10.5)
nRBC: 0 % (ref 0.0–0.2)

## 2020-05-13 LAB — COMPREHENSIVE METABOLIC PANEL
ALT: 18 U/L (ref 0–44)
AST: 35 U/L (ref 15–41)
Albumin: 2.1 g/dL — ABNORMAL LOW (ref 3.5–5.0)
Alkaline Phosphatase: 80 U/L (ref 38–126)
Anion gap: 7 (ref 5–15)
BUN: 8 mg/dL (ref 6–20)
CO2: 23 mmol/L (ref 22–32)
Calcium: 8.4 mg/dL — ABNORMAL LOW (ref 8.9–10.3)
Chloride: 107 mmol/L (ref 98–111)
Creatinine, Ser: 0.93 mg/dL (ref 0.44–1.00)
GFR calc Af Amer: >60 mL/min (ref >60)
GFR calc non Af Amer: >60 mL/min (ref >60)
Glucose, Bld: 122 mg/dL — ABNORMAL HIGH (ref 70–99)
Potassium: 4.2 mmol/L (ref 3.5–5.1)
Sodium: 137 mmol/L (ref 135–145)
Total Bilirubin: 0.2 mg/dL — ABNORMAL LOW (ref 0.3–1.2)
Total Protein: 5.5 g/dL — ABNORMAL LOW (ref 6.5–8.1)

## 2020-05-13 MED ORDER — OXYCODONE HCL 5 MG PO TABS: 5.0000 mg | ORAL_TABLET | ORAL | Status: DC | PRN

## 2020-05-13 NOTE — Progress Notes (Signed)
Subjective: Postpartum Day 2: Cesarean Delivery Patient reports tolerating PO.  Foley removed last night because of leakage of urine around catheter.  Patient reports continued leakage of urine without urge.  Urine color has dramatically improved; current urine in top hat is light pink.  Patient requests abdominal binder.  Objective: Vital signs in last 24 hours: Temp:  [98.4 F (36.9 C)-98.7 F (37.1 C)] 98.7 F (37.1 C) (09/23 0538) Pulse Rate:  [71-85] 79 (09/23 0538) Resp:  [17-19] 17 (09/23 0538) BP: (124-127)/(72-82) 126/73 (09/23 0538) SpO2:  [99 %-100 %] 99 % (09/22 1955)  Physical Exam:  General: alert, cooperative and appears stated age 29: appropriate Uterine Fundus: firm Incision: healing well, honeycomb dressing 20% saturated with old blood DVT Evaluation: No evidence of DVT seen on physical exam. Negative Homan's sign. No cords or calf tenderness.  Recent Labs    05/12/20 0520 05/13/20 0813  HGB 8.9* 8.8*  HCT 27.4* 27.0*    Assessment/Plan: Status post Cesarean section. Doing well postoperatively.  Continue current care. Urinary incontinence-patient counseled that this is fairly common postpartum especially given 4 hours of pushing.  Anticipate slow, daily improvement. Hematuria-again, counseled re: bladder trauma with 4 hours of pushing (hematuria reported prior to going to OR) as well as with C/S delivery and given improvement, anticipate continued clearing of urine.  If no continued improvement, consider imaging to r/o bladder injury.   Replace honeycomb dressing and abdominal binder ordered.  Mitchel Honour 05/13/2020, 9:40 AM

## 2020-05-13 NOTE — Lactation Note (Signed)
This note was copied from a baby's chart. Lactation Consultation Note  Patient Name: Lisa Powers [id number] Date: 05/13/2020 Reason for consult: Follow-up assessment Baby 43hrs old, wt loss 6%, mom sitting in bed holding sleeping baby skin to skin. Mom reports baby just finished breastfeeding, nursed ~2mins, denies pain with latch, cracked, bleeding or pinched nipples. Mom states feedings are going much better, hears swallows, has pumped twice today after feedings, collected ~69mls from last pump session. Reinforced cue based feedings, signs of proper latch, signs of adequate milk transfer, avoid pacifier use, community resources and [facility] BF brochure with numbers for [facility] telephone and outpatient support. Mom to call if latch assistance needed, otherwise will f/u tomorrow. Mom voiced understanding and no further concerns. Left the room with mom still holding baby skin to skin.   Maternal Data    Feeding Feeding Type: Breast Fed  LATCH Score                   Interventions Interventions: Breast feeding basics reviewed  Lactation Tools Discussed/Used     Consult Status Consult Status: Follow-up Date: 05/14/20 Follow-up type: In-patient    Charlynn Court 05/13/2020, 2:10 PM

## 2020-05-13 NOTE — Progress Notes (Signed)
Pt had OU through out the night. C/o of not feeling the urge to urinate, but leaking urine. Encouraged pt to do Kegel exercise. Will continue to monitor.

## 2020-05-14 MED ORDER — OXYCODONE HCL 5 MG PO TABS
5.0000 mg | ORAL_TABLET | ORAL | 0 refills | Status: DC | PRN
Start: 2020-05-14 — End: 2021-05-11

## 2020-05-14 NOTE — Lactation Note (Signed)
This note was copied from a baby's chart. Lactation Consultation Note  Patient Name: Lisa Powers [id number] Date: 05/14/2020 Reason for consult: Follow-up assessment  P2 mother whose infant is now 21 hours old.  Mother breast fed her first child but did state that the first baby had a tongue and lip tie.  She encountered latching issues and ended up providing formula.  Mother had no further questions/concerns.  She feels like baby has been latching well and does not have any concerns about a tongue/lip tie.  Her nipples are tender from cluster feeding but are intact.  She is using organic balm and comfort gels for nipple soreness.  Encouraged to continue feeding 8-12 times/24 hours of sooner if baby shows cues.  She is hand expressing and feeding back EBM to baby via spoon.  Mother has a follow up with the pediatrician on Sunday.  Engorgement prevention/treatment reviewed.  She has a manual pump and a DEBP for home use.  She also has our OP phone number for questions after discharge.  Father present.   Maternal Data    Feeding Feeding Type: Breast Fed  LATCH Score Latch: Grasps breast easily, tongue down, lips flanged, rhythmical sucking.  Audible Swallowing: Spontaneous and intermittent  Type of Nipple: Everted at rest and after stimulation  Comfort (Breast/Nipple): Filling, red/small blisters or bruises, mild/mod discomfort  Hold (Positioning): No assistance needed to correctly position infant at breast.  LATCH Score: 9  Interventions    Lactation Tools Discussed/Used     Consult Status Consult Status: Complete Date: 05/14/20 Follow-up type: Call as needed    Jeffery Bachmeier R Argus Caraher 05/14/2020, 10:38 AM

## 2020-05-14 NOTE — Progress Notes (Signed)
Subjective: Postpartum Day 3: Cesarean Delivery Patient reports well controlled pain. She is ambulating well. Tolerating PO. +BM.  Still reports some incontinence and light hematuria, but now wonders if mixed with lochia.  Lochia is light.  She strongly desires discharge today.  Objective: Vital signs in last 24 hours: Temp:  [98.4 F (36.9 C)-98.5 F (36.9 C)] 98.5 F (36.9 C) (09/23 2220) Pulse Rate:  [79-83] 83 (09/23 2220) Resp:  [18-20] 20 (09/23 2220) BP: (126-127)/(80-89) 127/89 (09/23 2220) SpO2:  [100 %] 100 % (09/23 1506)  Physical Exam:  General: alert, cooperative and appears stated age Lochia: appropriate Uterine Fundus: firm Incision: c/d/i with honeycomb dressing in place. DVT Evaluation: No evidence of DVT seen on physical exam. Negative Homan's sign. No cords or calf tenderness.  Recent Labs    05/12/20 0520 05/13/20 0813  HGB 8.9* 8.8*  HCT 27.4* 27.0*    Assessment/Plan: Status post Cesarean section. Doing well postoperatively.  Continue current care. Urinary incontinence-patient counseled that this is fairly common postpartum especially given 4 hours of pushing.  Anticipate slow, daily improvement, as well as with possible hematuria. - HGB stable yesterday -Patient strongly desires discharge today.  Counseled on monitoring urinary status.   -Has a history of wound complication with prior C section, will plan for 1 week incision check.  -Otherwise patient is recovering well and meeting milestones.  Will DC home today.  Lyn Henri 05/14/2020, 7:28 AM

## 2020-05-14 NOTE — Discharge Summary (Signed)
Postpartum Discharge Summary     Patient Name: Lisa Powers DOB: August 18, 1991 MRN: [id number]  Date of admission: 05/11/2020 Delivery date:05/11/2020  Delivering provider: Candice Camp  Date of discharge: 05/14/2020  Admitting diagnosis: Encounter for induction of labor [Z34.90] Intrauterine pregnancy: [redacted]w[redacted]d     Secondary diagnosis:  Active Problems:   Encounter for induction of labor  Patient presented on 05/11/20 for scheduled induction of labor for term GA, with history of previous low transverse C section.  She was counseled extensively regarding TOLAC and associated risks.  She elected for TOLAC induction.  Labor was complicated by arrest of descent and patient underwent repeat cesarean section.  Postpartum, her course has been complicated by hematuria, likely secondary to catheter use and prolonged pushing.  She has also had some incontinence that improved over the course of her hospital stay.  On PPD#3 she strongly desired discharge home. She was ambulating without difficulty, voiding, tolerating PO, reported well controlled pain.  She was discharged home in stable condition on 05/14/2020 with plans for 1 week follow up for incision check, as patient has a history of wound complication following her first C section and reassess urinary habits.  All questions answered.  Physical exam  Vitals:   05/12/20 1955 05/13/20 0538 05/13/20 1506 05/13/20 2220  BP:  126/73 126/80 127/89  Pulse: 85 79 79 83  Resp: 19 17 18 20   Temp: 98.4 F (36.9 C) 98.7 F (37.1 C) 98.4 F (36.9 C) 98.5 F (36.9 C)  TempSrc: Oral Oral Oral Oral  SpO2: 99%  100%   Weight:      Height:       General: alert, cooperative and no distress Lochia: appropriate Uterine Fundus: firm Incision: Healing well with no significant drainage DVT Evaluation: No evidence of DVT seen on physical exam. Labs: Lab Results  Component Value Date   WBC 17.8 (H) 05/13/2020   HGB 8.8 (L) 05/13/2020   HCT 27.0 (L) 05/13/2020    MCV 93.1 05/13/2020   PLT 137 (L) 05/13/2020   CMP Latest Ref Rng & Units 05/13/2020  Glucose 70 - 99 mg/dL 05/15/2020)  BUN 6 - 20 mg/dL 8  Creatinine 151(V - 6.16 mg/dL 0.73  Sodium 7.10 - 626 mmol/L 137  Potassium 3.5 - 5.1 mmol/L 4.2  Chloride 98 - 111 mmol/L 107  CO2 22 - 32 mmol/L 23  Calcium 8.9 - 10.3 mg/dL 948)  Total Protein 6.5 - 8.1 g/dL 5.4(O)  Total Bilirubin 0.3 - 1.2 mg/dL 2.7(O)  Alkaline Phos 38 - 126 U/L 80  AST 15 - 41 U/L 35  ALT 0 - 44 U/L 18   Edinburgh Score: Edinburgh Postnatal Depression Scale Screening Tool 05/12/2020  I have been able to laugh and see the funny side of things. 0  I have looked forward with enjoyment to things. 0  I have blamed myself unnecessarily when things went wrong. 2  I have been anxious or worried for no good reason. 2  I have felt scared or panicky for no good reason. 2  Things have been getting on top of me. 1  I have been so unhappy that I have had difficulty sleeping. 1  I have felt sad or miserable. 1  I have been so unhappy that I have been crying. 1  The thought of harming myself has occurred to me. 0  Edinburgh Postnatal Depression Scale Total 10     After visit meds:  Allergies as of 05/14/2020   No Known Allergies  Medication List    STOP taking these medications   oxyCODONE-acetaminophen 5-325 MG tablet Commonly known as: Percocet     TAKE these medications   acetaminophen 325 MG tablet Commonly known as: TYLENOL Take 650 mg by mouth every 6 (six) hours as needed for mild pain or headache.   albuterol 108 (90 Base) MCG/ACT inhaler Commonly known as: VENTOLIN HFA Inhale 1-2 puffs into the lungs every 6 (six) hours as needed for wheezing or shortness of breath.   ibuprofen 800 MG tablet Commonly known as: Advil Take 1 tablet (800 mg total) by mouth every 6 (six) hours as needed.   oxyCODONE 5 MG immediate release tablet Commonly known as: Oxy IR/ROXICODONE Take 1 tablet (5 mg total) by mouth every  4 (four) hours as needed for moderate pain (not relieved by ibuprofen).   prenatal multivitamin Tabs tablet Take 1 tablet by mouth daily. DHA        Discharge home in stable condition Infant Feeding: Breast Infant Disposition:home with mother Discharge instruction: per After Visit Summary and Postpartum booklet. Activity: Advance as tolerated. Pelvic rest for 6 weeks.  Diet: routine diet Future Appointments:No future appointments. Follow up Visit:   05/14/2020 Lyn Henri, MD

## 2020-08-21 NOTE — L&D Delivery Note (Addendum)
OB/GYN Faculty Practice Delivery Note  Lisa Powers is a 30 y.o. [id number] s/p SVD at [redacted]w[redacted]d. She was admitted for PPROM and anhydramnios  ROM: Unsure but suspected to have initially occurred last Friday Maximum Maternal Temperature: 99.1   Delivery Date/Time: 0708 Delivery: Called to room and patient noted partial delivery. Head within vagina and delivered with one maternal effort. Cord clamped and cut. Fetus was wrapped in a blue towel and handed to mom. Placenta not yet delivered but will continue misoprostol until delivery. Initial inspection of fetus shows potential micrognatia (NIPS had been normal).   Placenta: Will send to pathology. Will also do placenta cultures Complications: None Lacerations: None EBL: 50 mL Analgesia: IV pain medication  Placenta delivered at about 725. It appeared intact/complete. It was thicker in appearance than expected and did not appear normal. Discussed option for genetic testing with parents.  Will send placenta to path and cultures collected.

## 2021-05-10 ENCOUNTER — Encounter (HOSPITAL_COMMUNITY): Payer: Self-pay | Admitting: Obstetrics and Gynecology

## 2021-05-10 ENCOUNTER — Inpatient Hospital Stay (HOSPITAL_BASED_OUTPATIENT_CLINIC_OR_DEPARTMENT_OTHER): Payer: BC Managed Care – PPO

## 2021-05-10 ENCOUNTER — Other Ambulatory Visit: Payer: Self-pay

## 2021-05-10 ENCOUNTER — Observation Stay (HOSPITAL_COMMUNITY)
Admission: AD | Admit: 2021-05-10 | Discharge: 2021-05-11 | Disposition: A | Discharge status: Home / Self Care | Payer: BC Managed Care – PPO | Attending: Obstetrics and Gynecology | Admitting: Obstetrics and Gynecology

## 2021-05-10 DIAGNOSIS — N898 Other specified noninflammatory disorders of vagina: Secondary | ICD-10-CM | POA: Diagnosis not present

## 2021-05-10 DIAGNOSIS — O209 Hemorrhage in early pregnancy, unspecified: Secondary | ICD-10-CM | POA: Diagnosis not present

## 2021-05-10 DIAGNOSIS — Z3A16 16 weeks gestation of pregnancy: Secondary | ICD-10-CM | POA: Diagnosis not present

## 2021-05-10 DIAGNOSIS — O26899 Other specified pregnancy related conditions, unspecified trimester: Secondary | ICD-10-CM

## 2021-05-10 DIAGNOSIS — Z79899 Other long term (current) drug therapy: Secondary | ICD-10-CM | POA: Diagnosis not present

## 2021-05-10 DIAGNOSIS — J45909 Unspecified asthma, uncomplicated: Secondary | ICD-10-CM | POA: Diagnosis not present

## 2021-05-10 DIAGNOSIS — O26892 Other specified pregnancy related conditions, second trimester: Secondary | ICD-10-CM

## 2021-05-10 DIAGNOSIS — R109 Unspecified abdominal pain: Secondary | ICD-10-CM | POA: Diagnosis not present

## 2021-05-10 DIAGNOSIS — O99512 Diseases of the respiratory system complicating pregnancy, second trimester: Secondary | ICD-10-CM | POA: Diagnosis not present

## 2021-05-10 DIAGNOSIS — Z20822 Contact with and (suspected) exposure to covid-19: Secondary | ICD-10-CM | POA: Insufficient documentation

## 2021-05-10 DIAGNOSIS — O42112 Preterm premature rupture of membranes, onset of labor more than 24 hours following rupture, second trimester: Secondary | ICD-10-CM | POA: Diagnosis not present

## 2021-05-10 DIAGNOSIS — Z87891 Personal history of nicotine dependence: Secondary | ICD-10-CM | POA: Insufficient documentation

## 2021-05-10 DIAGNOSIS — O132 Gestational [pregnancy-induced] hypertension without significant proteinuria, second trimester: Secondary | ICD-10-CM | POA: Diagnosis not present

## 2021-05-10 DIAGNOSIS — O42919 Preterm premature rupture of membranes, unspecified as to length of time between rupture and onset of labor, unspecified trimester: Secondary | ICD-10-CM

## 2021-05-10 DIAGNOSIS — Z8759 Personal history of other complications of pregnancy, childbirth and the puerperium: Secondary | ICD-10-CM

## 2021-05-10 LAB — URINALYSIS, ROUTINE W REFLEX MICROSCOPIC
Bilirubin Urine: NEGATIVE
Glucose, UA: NEGATIVE mg/dL
Ketones, ur: NEGATIVE mg/dL
Nitrite: NEGATIVE
Protein, ur: NEGATIVE mg/dL
Specific Gravity, Urine: 1.02 (ref 1.005–1.030)
pH: 7 (ref 5.0–8.0)

## 2021-05-10 LAB — URINALYSIS, MICROSCOPIC (REFLEX): WBC, UA: >50 WBC/hpf (ref 0–5)

## 2021-05-10 LAB — AMNISURE RUPTURE OF MEMBRANE (ROM) NOT AT ARMC: Amnisure ROM: NEGATIVE

## 2021-05-10 LAB — CBC
HCT: 36.4 % (ref 36.0–46.0)
Hemoglobin: 11.8 g/dL — ABNORMAL LOW (ref 12.0–15.0)
MCH: 28.5 pg (ref 26.0–34.0)
MCHC: 32.4 g/dL (ref 30.0–36.0)
MCV: 87.9 fL (ref 80.0–100.0)
Platelets: 237 10*3/uL (ref 150–400)
RBC: 4.14 MIL/uL (ref 3.87–5.11)
RDW: 13.3 % (ref 11.5–15.5)
WBC: 13.5 10*3/uL — ABNORMAL HIGH (ref 4.0–10.5)
nRBC: 0 % (ref 0.0–0.2)

## 2021-05-10 LAB — WET PREP, GENITAL
Clue Cells Wet Prep HPF POC: NONE SEEN
Sperm: NONE SEEN
Trich, Wet Prep: NONE SEEN
Yeast Wet Prep HPF POC: NONE SEEN

## 2021-05-10 LAB — RESP PANEL BY RT-PCR (FLU A&B, COVID) ARPGX2
Influenza A by PCR: NEGATIVE
Influenza B by PCR: NEGATIVE
SARS Coronavirus 2 by RT PCR: NEGATIVE

## 2021-05-10 LAB — TYPE AND SCREEN
ABO/RH(D): A POS
Antibody Screen: NEGATIVE

## 2021-05-10 MED ORDER — FENTANYL CITRATE (PF) 100 MCG/2ML IJ SOLN
50.0000 ug | INTRAMUSCULAR | Status: DC | PRN
  Administered 2021-05-11: 50 ug via INTRAVENOUS
  Administered 2021-05-11: 100 ug via INTRAVENOUS
  Administered 2021-05-11: 50 ug via INTRAVENOUS
  Filled 2021-05-10 (×3): qty 2

## 2021-05-10 MED ORDER — SODIUM CHLORIDE 0.9% FLUSH
3.0000 mL | Freq: Two times a day (BID) | INTRAVENOUS | Status: DC
  Administered 2021-05-10 – 2021-05-11 (×2): 3 mL via INTRAVENOUS

## 2021-05-10 MED ORDER — SODIUM CHLORIDE 0.9% FLUSH: 3.0000 mL | INTRAVENOUS | Status: DC | PRN

## 2021-05-10 MED ORDER — SODIUM CHLORIDE 0.9 % IV SOLN: 250.0000 mL | INTRAVENOUS | Status: DC | PRN

## 2021-05-10 MED ORDER — DOCUSATE SODIUM 100 MG PO CAPS: 100.0000 mg | ORAL_CAPSULE | Freq: Every day | ORAL | Status: DC

## 2021-05-10 MED ORDER — CALCIUM CARBONATE ANTACID 500 MG PO CHEW: 2.0000 | CHEWABLE_TABLET | ORAL | Status: DC | PRN

## 2021-05-10 MED ORDER — ACETAMINOPHEN 325 MG PO TABS: 650.0000 mg | ORAL_TABLET | ORAL | Status: DC | PRN

## 2021-05-10 NOTE — H&P (Signed)
History     CSN: [id number]  Arrival date and time: 05/10/21 1818   Event Date/Time   First Provider Initiated Contact with Patient 05/10/21 1914      Chief Complaint  Patient presents with   Urinary retention   Urinary leakage   Vaginal Discharge   Vaginal Bleeding   HPI  Lisa Powers is a 30 y.o. female [phone] @ [redacted]w[redacted]d here in MAU with complaints of urinary leakage, leaking of vaginal discharge and abdominal pain. She reports the leaking became worse and more noticeable last week. She was seen in the office on Friday and had vaginal swabs done and was told they were normal. Reports after her C/s of her 2nd child exactly 1 year ago, she immedietly started having urinary leakage following delivery; she was told this was normal given the amount of time she pushed in an attempt for a TOLAC.  She reports everyday leaking of urine for about 1 week. Reports having to wear a pad d/t the amount  of urine leaking. No odor, or dysuria. Reports urine is pale yellow in color.  She reports urinary retention, where she feels like she is not emptying her bladder completely after urinating. She reports soaking through toilet paper and her Marjo Bicker clothe diapers, and one time she soaked through her clothes. She reports pain at her C/S scar that is constant. The pain has affected her everyday life and activities.  The pain is a pulling sharp pain,and it occurs everyday. The pain has worsened as her pregnancy has progressed.   OB History     Gravida  4   Para  2   Term  2   Preterm      AB  1   Living  2      SAB  1   IAB      Ectopic      Multiple  0   Live Births  2           Past Medical History:  Diagnosis Date   GERD (gastroesophageal reflux disease)    occasional, no meds, diet controlled   History of gestational hypertension    resoloved after c/s delivery   Pilonidal disease    W/ RECURRENT INFECTION   Postpartum anxiety 2019   Postpartum depression 2019    Seasonal asthma    Wears contact lenses     Past Surgical History:  Procedure Laterality Date   CESAREAN SECTION N/A 10/16/2017   Procedure: CESAREAN SECTION;  Surgeon: Harold Hedge, MD;  Location: [facility] BIRTHING SUITES;  Service: Obstetrics;  Laterality: N/A;   CESAREAN SECTION N/A 05/11/2020   Procedure: CESAREAN SECTION;  Surgeon: Candice Camp, MD;  Location: [facility] LD ORS;  Service: Obstetrics;  Laterality: N/A;   DILATION AND EVACUATION N/A 06/10/2019   Procedure: DILATATION AND EVACUATION;  Surgeon: Mitchel Honour, DO;  Location: [facility] OR;  Service: Gynecology;  Laterality: N/A;   PILONIDAL CYST EXCISION N/A 01/30/2018   Procedure: EXCISION PILONIDAL DISEASE EXTENSIVE ERAS PATHWAY;  Surgeon: Karie Soda, MD;  Location: [facility];  Service: General;  Laterality: N/A;   WISDOM TOOTH EXTRACTION      Family History  Problem Relation Age of Onset   Hyperlipidemia Mother    Cancer Father    Hyperlipidemia Father    Heart disease Maternal Grandmother     Social History   Tobacco Use   Smoking status: Former    Packs/day: 0.25    Types: Cigarettes  Quit date: 11/20/2018    Years since quitting: 2.4   Smokeless tobacco: Never   Tobacco comments:    01-23-2018  per pt was at 1ppd prior to preg. now 1pp3d  Vaping Use   Vaping Use: Never used  Substance Use Topics   Alcohol use: No   Drug use: No    Allergies: No Known Allergies  Medications Prior to Admission  Medication Sig Dispense Refill Last Dose   Prenatal Vit-Fe Fumarate-FA (PRENATAL MULTIVITAMIN) TABS tablet Take 1 tablet by mouth daily. DHA   05/09/2021   acetaminophen (TYLENOL) 325 MG tablet Take 650 mg by mouth every 6 (six) hours as needed for mild pain or headache.      albuterol (PROVENTIL HFA;VENTOLIN HFA) 108 (90 Base) MCG/ACT inhaler Inhale 1-2 puffs into the lungs every 6 (six) hours as needed for wheezing or shortness of breath. (Patient not taking: Reported on 05/11/2020) 1 Inhaler 0    ibuprofen  (ADVIL) 800 MG tablet Take 1 tablet (800 mg total) by mouth every 6 (six) hours as needed. (Patient not taking: Reported on 05/11/2020) 30 tablet 0    oxyCODONE (OXY IR/ROXICODONE) 5 MG immediate release tablet Take 1 tablet (5 mg total) by mouth every 4 (four) hours as needed for moderate pain (not relieved by ibuprofen). 18 tablet 0    Results for orders placed or performed during the hospital encounter of 05/10/21 (from the past 48 hour(s))  Urinalysis, Routine w reflex microscopic Urine, Clean Catch     Status: Abnormal   Collection Time: 05/10/21  6:38 PM  Result Value Ref Range   Color, Urine YELLOW YELLOW   APPearance HAZY (A) CLEAR   Specific Gravity, Urine 1.020 1.005 - 1.030   pH 7.0 5.0 - 8.0   Glucose, UA NEGATIVE NEGATIVE mg/dL   Hgb urine dipstick SMALL (A) NEGATIVE   Bilirubin Urine NEGATIVE NEGATIVE   Ketones, ur NEGATIVE NEGATIVE mg/dL   Protein, ur NEGATIVE NEGATIVE mg/dL   Nitrite NEGATIVE NEGATIVE   Leukocytes,Ua MODERATE (A) NEGATIVE    Comment: Performed at [facility], [address], [place], [place] [address]  Urinalysis, Microscopic (reflex)     Status: Abnormal   Collection Time: 05/10/21  6:38 PM  Result Value Ref Range   RBC / HPF 21-50 0 - 5 RBC/hpf   WBC, UA >50 0 - 5 WBC/hpf   Bacteria, UA FEW (A) NONE SEEN   Squamous Epithelial / LPF 6-10 0 - 5   Mucus PRESENT     Comment: Performed at [facility], [address], [place], [place] [address]  Amnisure rupture of membrane (rom)not at [facility]     Status: None   Collection Time: 05/10/21  7:48 PM  Result Value Ref Range   Amnisure ROM NEGATIVE     Comment: Performed at [facility], [address] [place], [place] [address]  Wet prep, genital     Status: Abnormal   Collection Time: 05/10/21  7:52 PM   Specimen: Cervix  Result Value Ref Range   Yeast Wet Prep HPF POC NONE SEEN NONE SEEN   Trich, Wet Prep NONE SEEN NONE SEEN   Clue Cells Wet Prep HPF POC NONE SEEN NONE SEEN   WBC,  Wet Prep HPF POC MANY (A) NONE SEEN   Sperm NONE SEEN     Comment: Performed at [facility], [address] [place], [place] [address]   No results found.    Review  of Systems  Constitutional:  Negative for fever.  Gastrointestinal:  Positive for abdominal pain.  Genitourinary:  Positive for vaginal discharge. Negative for vaginal bleeding and vaginal pain.  Physical Exam   Blood pressure 129/77, pulse 90, temperature 98 F (36.7 C), temperature source Oral, resp. rate 19, height 5\' 4"  (1.626 m), weight 108.2 kg, SpO2 100 %, unknown if currently breastfeeding.  Physical Exam Vitals reviewed.  Constitutional:      General: She is not in acute distress.    Appearance: Normal appearance. She is not ill-appearing, toxic-appearing or diaphoretic.  HENT:     Head: Normocephalic.  Eyes:     Pupils: Pupils are equal, round, and reactive to light.  Genitourinary:    Comments: Vagina - moderate amount of mucoid, pale yellow vaginal discharge, no odor. + pooling in the vault.  Cervix - No contact bleeding, no active bleeding. Cervix appears long and closed  Bimanual exam: deferred GC/Chlam, wet prep done, amnisure  Chaperone present for exam. Exam done by , FNP Skin:    General: Skin is warm.  Neurological:     Mental Status: She is alert and oriented to person, place, and time.  Psychiatric:        Behavior: Behavior normal.   Assessment and Plan   A:  1. Preterm premature rupture of membranes (PPROM) with unknown onset of labor   2. Abdominal pain in pregnancy   3. Vaginal discharge in pregnancy   4. [redacted] weeks gestation of pregnancy       P:  Admit to Ante Please see Dr. Venia Carbon note regarding counseling of patient care options.  Support given   Colvin Caroli, NP 05/10/2021 9:37 PM

## 2021-05-10 NOTE — MAU Note (Addendum)
Presents stating she has urinary leakage and retention.  Reports was evaluated last Friday, informed not leaking amniotic fluid.  Also reports spotting and yellow/brown vaginal discharge.  Reports discharge is copious and doesn't have any odor.  States spotting began Monday.  Denies recent intercourse.  Pt also reports has intermittent cramping burning sensation @ C-Section incision.

## 2021-05-10 NOTE — MAU Provider Note (Signed)
History     CSN: [id number]  Arrival date and time: 05/10/21 1818   Event Date/Time   First Provider Initiated Contact with Patient 05/10/21 1914      Chief Complaint  Patient presents with   Urinary retention   Urinary leakage   Vaginal Discharge   Vaginal Bleeding   HPI  Ms.MAZELL AYLESWORTH is a 30 y.o. female [phone] @ [redacted]w[redacted]d here in MAU with complaints of urinary leakage, leaking of vaginal discharge and abdominal pain. She reports the leaking became worse and more noticeable last week. She was seen in the office on Friday and had vaginal swabs done and was told they were normal. Reports after her C/s of her 2nd child exactly 1 year ago, she immedietly started having urinary leakage following delivery; she was told this was normal given the amount of time she pushed in an attempt for a TOLAC.  She reports everyday leaking of urine for about 1 week. Reports having to wear a pad d/t the amount  of urine leaking. No odor, or dysuria. Reports urine is pale yellow in color.  She reports urinary retention, where she feels like she is not emptying her bladder completely after urinating. She reports soaking through toilet paper and her Marjo Bicker clothe diapers, and one time she soaked through her clothes. She reports pain at her C/S scar that is constant. The pain has affected her everyday life and activities.  The pain is a pulling sharp pain,and it occurs everyday. The pain has worsened as her pregnancy has progressed.   OB History     Gravida  4   Para  2   Term  2   Preterm      AB  1   Living  2      SAB  1   IAB      Ectopic      Multiple  0   Live Births  2           Past Medical History:  Diagnosis Date   GERD (gastroesophageal reflux disease)    occasional, no meds, diet controlled   History of gestational hypertension    resoloved after c/s delivery   Pilonidal disease    W/ RECURRENT INFECTION   Postpartum anxiety 2019   Postpartum depression 2019    Seasonal asthma    Wears contact lenses     Past Surgical History:  Procedure Laterality Date   CESAREAN SECTION N/A 10/16/2017   Procedure: CESAREAN SECTION;  Surgeon: Harold Hedge, MD;  Location: [facility] BIRTHING SUITES;  Service: Obstetrics;  Laterality: N/A;   CESAREAN SECTION N/A 05/11/2020   Procedure: CESAREAN SECTION;  Surgeon: Candice Camp, MD;  Location: [facility] LD ORS;  Service: Obstetrics;  Laterality: N/A;   DILATION AND EVACUATION N/A 06/10/2019   Procedure: DILATATION AND EVACUATION;  Surgeon: Mitchel Honour, DO;  Location: [facility] OR;  Service: Gynecology;  Laterality: N/A;   PILONIDAL CYST EXCISION N/A 01/30/2018   Procedure: EXCISION PILONIDAL DISEASE EXTENSIVE ERAS PATHWAY;  Surgeon: Karie Soda, MD;  Location: [facility];  Service: General;  Laterality: N/A;   WISDOM TOOTH EXTRACTION      Family History  Problem Relation Age of Onset   Hyperlipidemia Mother    Cancer Father    Hyperlipidemia Father    Heart disease Maternal Grandmother     Social History   Tobacco Use   Smoking status: Former    Packs/day: 0.25    Types: Cigarettes  Quit date: 11/20/2018    Years since quitting: 2.4   Smokeless tobacco: Never   Tobacco comments:    01-23-2018  per pt was at 1ppd prior to preg. now 1pp3d  Vaping Use   Vaping Use: Never used  Substance Use Topics   Alcohol use: No   Drug use: No    Allergies: No Known Allergies  Medications Prior to Admission  Medication Sig Dispense Refill Last Dose   Prenatal Vit-Fe Fumarate-FA (PRENATAL MULTIVITAMIN) TABS tablet Take 1 tablet by mouth daily. DHA   05/09/2021   acetaminophen (TYLENOL) 325 MG tablet Take 650 mg by mouth every 6 (six) hours as needed for mild pain or headache.      albuterol (PROVENTIL HFA;VENTOLIN HFA) 108 (90 Base) MCG/ACT inhaler Inhale 1-2 puffs into the lungs every 6 (six) hours as needed for wheezing or shortness of breath. (Patient not taking: Reported on 05/11/2020) 1 Inhaler 0    ibuprofen  (ADVIL) 800 MG tablet Take 1 tablet (800 mg total) by mouth every 6 (six) hours as needed. (Patient not taking: Reported on 05/11/2020) 30 tablet 0    oxyCODONE (OXY IR/ROXICODONE) 5 MG immediate release tablet Take 1 tablet (5 mg total) by mouth every 4 (four) hours as needed for moderate pain (not relieved by ibuprofen). 18 tablet 0    Results for orders placed or performed during the hospital encounter of 05/10/21 (from the past 48 hour(s))  Urinalysis, Routine w reflex microscopic Urine, Clean Catch     Status: Abnormal   Collection Time: 05/10/21  6:38 PM  Result Value Ref Range   Color, Urine YELLOW YELLOW   APPearance HAZY (A) CLEAR   Specific Gravity, Urine 1.020 1.005 - 1.030   pH 7.0 5.0 - 8.0   Glucose, UA NEGATIVE NEGATIVE mg/dL   Hgb urine dipstick SMALL (A) NEGATIVE   Bilirubin Urine NEGATIVE NEGATIVE   Ketones, ur NEGATIVE NEGATIVE mg/dL   Protein, ur NEGATIVE NEGATIVE mg/dL   Nitrite NEGATIVE NEGATIVE   Leukocytes,Ua MODERATE (A) NEGATIVE    Comment: Performed at [facility], [address], [place], [place] [address]  Urinalysis, Microscopic (reflex)     Status: Abnormal   Collection Time: 05/10/21  6:38 PM  Result Value Ref Range   RBC / HPF 21-50 0 - 5 RBC/hpf   WBC, UA >50 0 - 5 WBC/hpf   Bacteria, UA FEW (A) NONE SEEN   Squamous Epithelial / LPF 6-10 0 - 5   Mucus PRESENT     Comment: Performed at [facility], [address], [place], [place] [address]  Amnisure rupture of membrane (rom)not at [facility]     Status: None   Collection Time: 05/10/21  7:48 PM  Result Value Ref Range   Amnisure ROM NEGATIVE     Comment: Performed at [facility], [address] [place], [place] [address]  Wet prep, genital     Status: Abnormal   Collection Time: 05/10/21  7:52 PM   Specimen: Cervix  Result Value Ref Range   Yeast Wet Prep HPF POC NONE SEEN NONE SEEN   Trich, Wet Prep NONE SEEN NONE SEEN   Clue Cells Wet Prep HPF POC NONE SEEN NONE SEEN   WBC,  Wet Prep HPF POC MANY (A) NONE SEEN   Sperm NONE SEEN     Comment: Performed at [facility], [address] [place], [place] [address]   No results found.    Review  of Systems  Constitutional:  Negative for fever.  Gastrointestinal:  Positive for abdominal pain.  Genitourinary:  Positive for vaginal discharge. Negative for vaginal bleeding and vaginal pain.  Physical Exam   Blood pressure 129/77, pulse 90, temperature 98 F (36.7 C), temperature source Oral, resp. rate 19, height 5\' 4"  (1.626 m), weight 108.2 kg, SpO2 100 %, unknown if currently breastfeeding.  Physical Exam Vitals reviewed.  Constitutional:      General: She is not in acute distress.    Appearance: Normal appearance. She is not ill-appearing, toxic-appearing or diaphoretic.  HENT:     Head: Normocephalic.  Eyes:     Pupils: Pupils are equal, round, and reactive to light.  Genitourinary:    Comments: Vagina - moderate amount of mucoid, pale yellow vaginal discharge, no odor. + pooling in the vault.  Cervix - No contact bleeding, no active bleeding. Cervix appears long and closed  Bimanual exam: deferred GC/Chlam, wet prep done, amnisure  Chaperone present for exam. Exam done by , FNP Skin:    General: Skin is warm.  Neurological:     Mental Status: She is alert and oriented to person, place, and time.  Psychiatric:        Behavior: Behavior normal.    MAU Course  Procedures None  MDM  + fetal heart tones via doppler  Urine bladder scan: 1 ML in bladder.  Amnisure negative Fern positive; observed by myself and Dr. Venia Carbon.  MFM Para March shows anhydramnio's  Discussed results of fern slide, and [place] with the patient and her partner. Patient and partner voiced concerns with current OB practice and the concerns with going forward with their care. Offered faculty practice MD to partner and patient to discuss plan of care going forward. Patient and partner requesting faculty MD and voiced  appreciation.  Dr. [place] at bedside shortly after discussion.  Dr. Para March notified of patient and partners wishes.   Assessment and Plan   A:  1. Preterm premature rupture of membranes (PPROM) with unknown onset of labor   2. Abdominal pain in pregnancy   3. Vaginal discharge in pregnancy   4. [redacted] weeks gestation of pregnancy       P:  Admit to Ante Please see Dr. Henderson Cloud note regarding counseling of patient care options.  Support given   Armanda Heritage, NP 05/10/2021 9:37 PM

## 2021-05-11 DIAGNOSIS — Z3A16 16 weeks gestation of pregnancy: Secondary | ICD-10-CM | POA: Diagnosis not present

## 2021-05-11 DIAGNOSIS — O26892 Other specified pregnancy related conditions, second trimester: Secondary | ICD-10-CM | POA: Diagnosis not present

## 2021-05-11 LAB — GC/CHLAMYDIA PROBE AMP (~~LOC~~) NOT AT ARMC
Chlamydia: NEGATIVE
Comment: NEGATIVE
Comment: NORMAL
Neisseria Gonorrhea: NEGATIVE

## 2021-05-11 MED ORDER — DOXYCYCLINE HYCLATE 100 MG PO CAPS: 100.0000 mg | ORAL_CAPSULE | Freq: Two times a day (BID) | ORAL | 0 refills | Status: DC

## 2021-05-11 MED ORDER — ACETAMINOPHEN 325 MG PO TABS: 650.0000 mg | ORAL_TABLET | ORAL | Status: DC | PRN

## 2021-05-11 MED ORDER — MISOPROSTOL 200 MCG PO TABS
400.0000 ug | ORAL_TABLET | ORAL | Status: DC
  Administered 2021-05-11 (×2): 400 ug via BUCCAL
  Filled 2021-05-11 (×2): qty 2

## 2021-05-11 MED ORDER — SENNOSIDES-DOCUSATE SODIUM 8.6-50 MG PO TABS: 2.0000 | ORAL_TABLET | Freq: Every day | ORAL | Status: DC

## 2021-05-11 MED ORDER — DIPHENHYDRAMINE HCL 25 MG PO CAPS: 25.0000 mg | ORAL_CAPSULE | Freq: Four times a day (QID) | ORAL | Status: DC | PRN

## 2021-05-11 MED ORDER — BUTORPHANOL TARTRATE 1 MG/ML IJ SOLN
INTRAMUSCULAR | Status: AC
  Filled 2021-05-11: qty 1

## 2021-05-11 MED ORDER — KETOROLAC TROMETHAMINE 30 MG/ML IJ SOLN
30.0000 mg | Freq: Once | INTRAMUSCULAR | Status: AC
  Administered 2021-05-11: 30 mg via INTRAVENOUS
  Filled 2021-05-11: qty 1

## 2021-05-11 MED ORDER — FLUCONAZOLE 150 MG PO TABS: 150.0000 mg | ORAL_TABLET | Freq: Once | ORAL | 0 refills | Status: AC

## 2021-05-11 MED ORDER — IBUPROFEN 600 MG PO TABS
600.0000 mg | ORAL_TABLET | Freq: Four times a day (QID) | ORAL | Status: DC
  Administered 2021-05-11: 600 mg via ORAL
  Filled 2021-05-11: qty 1

## 2021-05-11 MED ORDER — BUTORPHANOL TARTRATE 1 MG/ML IJ SOLN
1.0000 mg | INTRAMUSCULAR | Status: DC | PRN
Start: 2021-05-11 — End: 2021-05-11
  Administered 2021-05-11: 1 mg via INTRAVENOUS

## 2021-05-11 MED ORDER — ONDANSETRON HCL 4 MG PO TABS: 4.0000 mg | ORAL_TABLET | ORAL | Status: DC | PRN

## 2021-05-11 MED ORDER — IBUPROFEN 600 MG PO TABS: 600.0000 mg | ORAL_TABLET | Freq: Four times a day (QID) | ORAL | 1 refills | Status: AC

## 2021-05-11 MED ORDER — ONDANSETRON HCL 4 MG/2ML IJ SOLN: 4.0000 mg | INTRAMUSCULAR | Status: DC | PRN

## 2021-05-11 MED ORDER — ZOLPIDEM TARTRATE 5 MG PO TABS: 5.0000 mg | ORAL_TABLET | Freq: Every evening | ORAL | Status: DC | PRN

## 2021-05-11 MED ORDER — SIMETHICONE 80 MG PO CHEW: 80.0000 mg | CHEWABLE_TABLET | ORAL | Status: DC | PRN

## 2021-05-11 NOTE — Progress Notes (Signed)
Chaplain spent time with Lisa Powers and Lisa Powers as they began adjusting reality of the loss of their son [name]. Chaplain asked open ended questions to facilitate story telling, grief processing, and emotional expression. Lisa Powers shared that she was concerned last week, but felt like her concerns were "brushed off." She reached out again yesterday and was told to come to the hospital. Lisa Powers reports she thought she might have something wrong with her bladder. She never dreamed she might lose her baby. Couple report that they are still in shock and processing the news. Today is their son [name]'s first birthday.  Chaplain facilitated Associate Professor, offered education regarding disposition and local resources, self care, and coping after loss.   Please page as further needs arise.  Maryanna Shape. Carley Hammed, M.Div. [facility] Chaplain Pager [phone] Office [phone]       05/11/21 1000  Clinical Encounter Type  Visited With Patient and family together  Visit Type Psychological support;Spiritual support;Death  Spiritual Encounters  Spiritual Needs Emotional;Grief support  Stress Factors  Patient Stress Factors Loss

## 2021-05-11 NOTE — Discharge Summary (Signed)
Physician Obstetric Discharge Summary  Patient ID: Lisa Powers MRN: [id number] DOB/AGE: 1991/03/01 30 y.o.   Date of Admission: 05/10/2021  Date of Discharge: 05/11/21  Admitting Diagnosis: Premature rupture of membrane at [redacted]w[redacted]d  Secondary Diagnosis: preterm labor in second trimester  Mode of Delivery: normal spontaneous vaginal delivery     Discharge Diagnosis:  SVD of nonviable second trimester fetus   Intrapartum Procedures:  n/a   Post partum procedures:  n/a  Complications: none   Brief Hospital Course  Lisa Powers is a [id number] who had a SVD on 05/11/21;  for further details of this delivery, please refer to the delivery note.  Patient had an uncomplicated postpartum course.  By time of discharge on PPD#0, her pain was controlled on oral pain medications; she had appropriate lochia and was ambulating, voiding without difficulty and tolerating regular diet.  Pt desired to be discharged home today.  She was deemed stable for discharge to home.     Labs: CBC Latest Ref Rng & Units 05/10/2021 05/13/2020 05/12/2020  WBC 4.0 - 10.5 K/uL 13.5(H) 17.8(H) 26.0(H)  Hemoglobin 12.0 - 15.0 g/dL 11.8(L) 8.8(L) 8.9(L)  Hematocrit 36.0 - 46.0 % 36.4 27.0(L) 27.4(L)  Platelets 150 - 400 K/uL 237 137(L) 123(L)    --/--/A POS (09/20 2300)  Physical exam:  Blood pressure 127/61, pulse 74, temperature 98.3 F (36.8 C), temperature source Oral, resp. rate 18, height 5\' 4"  (1.626 m), weight 108.2 kg, SpO2 98 %, unknown if currently breastfeeding. CONSTITUTIONAL: Well-developed, well-nourished female in no acute distress.   HENT:  Normocephalic, atraumatic, External right and left ear normal. Oropharynx is clear and moist EYES: Conjunctivae and EOM are normal.    NECK: Normal range of motion, supple, no masses.  Normal thyroid.   SKIN: Skin is warm and dry. No rash noted. Not diaphoretic. No erythema. No pallor. NEUROLGIC: Alert and oriented to person, place, and time. Normal  reflexes, muscle tone coordination. No cranial nerve deficit noted. PSYCHIATRIC: Normal mood and affect. Normal behavior. Normal judgment and thought content. CARDIOVASCULAR: Normal heart rate noted RESPIRATORY:  Effort and breath sounds normal, no problems with respiration noted. ABDOMEN: Soft, no distention noted.   PELVIC: Uterine fundus is firm,nontender, appropriate lochia MUSCULOSKELETAL: Normal range of motion. No tenderness.  No cyanosis, clubbing, or edema.  2+ distal pulses. No evidence of DVT seen on physical exam.  Discharge Instructions: Per After Visit Summary. Activity: Advance as tolerated. Pelvic rest for 6 weeks.  Also refer to After Visit Summary Diet: Regular Medications: Allergies as of 05/11/2021   No Known Allergies      Medication List     STOP taking these medications    oxyCODONE 5 MG immediate release tablet Commonly known as: Oxy IR/ROXICODONE   prenatal multivitamin Tabs tablet       TAKE these medications    acetaminophen 325 MG tablet Commonly known as: TYLENOL Take 650 mg by mouth every 6 (six) hours as needed for mild pain or headache.   albuterol 108 (90 Base) MCG/ACT inhaler Commonly known as: VENTOLIN HFA Inhale 1-2 puffs into the lungs every 6 (six) hours as needed for wheezing or shortness of breath.   doxycycline 100 MG capsule Commonly known as: VIBRAMYCIN Take 1 capsule (100 mg total) by mouth 2 (two) times daily for 7 days.   fluconazole 150 MG tablet Commonly known as: DIFLUCAN Take 1 tablet (150 mg total) by mouth once for 1 dose. Can take additional dose three days later if symptoms  persist   ibuprofen 600 MG tablet Commonly known as: ADVIL Take 1 tablet (600 mg total) by mouth every 6 (six) hours. What changed:  medication strength how much to take when to take this reasons to take this       Postpartum contraception:  pt is unsure  Discharged Condition: Stable Discharged to: Home Outpatient follow up:    Follow-up Information     [facility] [employer] [facility] at [facility]. Schedule an appointment as soon as possible for a visit in 4 week(s).   Specialty: Obstetrics and Gynecology Why: postpartum for fetal loss, coming from private group Contact information: [address] [place] [address] [phone]                Disposition: funeral home  Mariel Aloe, MD, FACOG Obstetrician & Gynecologist, [employer] for [employer], [facility]

## 2021-05-11 NOTE — Progress Notes (Signed)
Netta denies cramping but notes some bleeding this evening. VSS, AF WBC 13.5 Plan: Reviewed plan of care with Dametra and Josh. They would like to start this process this evening. Reviewed medication process with misoprostol. Reviewed expectations for the delivery including delayed delivery of the placenta. Reviewed pain management options. Discussed lactation consult post-delivery. All questions answered.  Milas Hock, MD

## 2021-05-13 LAB — AEROBIC CULTURE W GRAM STAIN (SUPERFICIAL SPECIMEN)
Culture: NORMAL
Gram Stain: NONE SEEN

## 2021-05-13 LAB — SURGICAL PATHOLOGY

## 2021-05-14 LAB — AEROBIC CULTURE W GRAM STAIN (SUPERFICIAL SPECIMEN)
Culture: NORMAL
Gram Stain: NONE SEEN

## 2021-05-17 ENCOUNTER — Encounter: Payer: Self-pay | Admitting: General Practice

## 2021-05-17 ENCOUNTER — Other Ambulatory Visit: Payer: Self-pay

## 2021-05-17 ENCOUNTER — Ambulatory Visit (INDEPENDENT_AMBULATORY_CARE_PROVIDER_SITE_OTHER): Payer: BC Managed Care – PPO | Admitting: General Practice

## 2021-05-17 VITALS — BP 137/82 | HR 70 | Wt 236.0 lb

## 2021-05-17 DIAGNOSIS — R350 Frequency of micturition: Secondary | ICD-10-CM | POA: Diagnosis not present

## 2021-05-17 LAB — POCT URINALYSIS DIP (DEVICE)
Bilirubin Urine: NEGATIVE
Glucose, UA: NEGATIVE mg/dL
Ketones, ur: NEGATIVE mg/dL
Nitrite: NEGATIVE
Protein, ur: 100 mg/dL — AB
Specific Gravity, Urine: 1.02 (ref 1.005–1.030)
Urobilinogen, UA: 0.2 mg/dL (ref 0.0–1.0)
pH: 6.5 (ref 5.0–8.0)

## 2021-05-17 NOTE — Progress Notes (Addendum)
Patient presents to office today reporting severe pain with urination & straining with urination starting Wednesday night/Thursday. Patient reports no pain since then and does not have to strain with urination currently. She continues to have urinary frequency. Patient had 16 week delivery last week. She also reports past history of bladder trauma with last delivery in 2021. Denies headaches, dizziness or blurry vision today. UA isn't conclusive today. Discussed with Nolene Bernheim who advises urine culture and waiting for results prior to treatment. Discussed with patient and also recommended drinking at least 64 ounces of water as well to continue to help flush her bladder out. Patient verbalized understanding and will follow up at postpartum visit on 10/19.   Chase Caller RN BSN 05/17/21   Chart reviewed for nurse visit. Agree with plan of care.   Currie Paris, NP 05/18/2021 12:27 PM

## 2021-05-23 LAB — URINE CULTURE

## 2021-05-25 ENCOUNTER — Telehealth: Payer: Self-pay

## 2021-05-25 NOTE — Telephone Encounter (Signed)
-----   Message from Currie Paris, NP sent at 05/24/2021 10:39 AM EDT ----- Please call patient to see if she is feeling better.  Urine culture is back.  Viridans streptococcus was isolated and that happens sometimes, but according to the Journal of Medical Microbiology, viridans streptococcus is not a pathogen that causes a UTI and treatment is not indicated.  If she is not doing well, there may be something else going on.  Make sure she is taking in lots of fluids, 64 ounces, daily.

## 2021-05-25 NOTE — Telephone Encounter (Signed)
Call placed to pt. Spoke with pt. Pt given results and recommendations per Camelia Eng, NP.  Pt verbalized understanding. Pt states not having any more problems and all symptoms have cleared up! Routing to Terri, NP for update from pt.  Judeth Cornfield, RN

## 2021-05-31 ENCOUNTER — Telehealth: Payer: Self-pay | Admitting: Clinical

## 2021-05-31 NOTE — Telephone Encounter (Signed)
Pt declines IBH services at this time; pt agrees to write down [email or web address] (for access to perinatal loss online support groups), and will contact [facility] at [phone] as needed in the future.

## 2021-05-31 NOTE — Telephone Encounter (Signed)
Left HIPPA-compliant message to call back Asher Muir from Center for [employer] at [facility] at  [phone] [facility] office); left MyChart message for pt.

## 2021-06-08 ENCOUNTER — Ambulatory Visit: Payer: BC Managed Care – PPO | Admitting: Family Medicine

## 2021-06-28 ENCOUNTER — Ambulatory Visit: Payer: BC Managed Care – PPO | Admitting: Family Medicine

## 2021-07-05 ENCOUNTER — Ambulatory Visit: Payer: BC Managed Care – PPO | Admitting: Family Medicine

## 2021-07-07 ENCOUNTER — Other Ambulatory Visit: Payer: Self-pay

## 2021-07-07 ENCOUNTER — Encounter: Payer: Self-pay | Admitting: Family Medicine

## 2021-07-07 ENCOUNTER — Ambulatory Visit (INDEPENDENT_AMBULATORY_CARE_PROVIDER_SITE_OTHER): Payer: BC Managed Care – PPO | Admitting: Family Medicine

## 2021-07-07 VITALS — BP 124/76 | HR 59 | Wt 233.0 lb

## 2021-07-07 DIAGNOSIS — N3946 Mixed incontinence: Secondary | ICD-10-CM

## 2021-07-07 NOTE — Progress Notes (Signed)
normal   Post Partum Visit Note  ARREANNA MANGINE is a 30 y.o. [phone] female who presents for a postpartum visit. She is 8 weeks postpartum following a normal spontaneous vaginal delivery at 16 weeks, following previable PPROM.  I have fully reviewed the prenatal and intrapartum course. The delivery was at [redacted]w[redacted]d gestational weeks.  Anesthesia:  IV Pain . Postpartum course has been normal. Bleeding no bleeding. Bowel function is normal. Bladder function is normal. Patient is not sexually active. Contraception method is none. Postpartum depression screening: negative.  Patient had h/o TOLAC with 4.5 hours of pushing and deep transverse arrest with last baby. Reports several episodes of hematuria, on-going pelvic pain and significant urinary incontinence with bladder fullness, standing, bending over, coughing, sneezing.     Edinburgh Postnatal Depression Scale - 07/07/21 1135       Edinburgh Postnatal Depression Scale:  In the Past 7 Days   I have been able to laugh and see the funny side of things. 0    I have looked forward with enjoyment to things. 0    I have blamed myself unnecessarily when things went wrong. 2    I have been anxious or worried for no good reason. 2    I have felt scared or panicky for no good reason. 0    Things have been getting on top of me. 1    I have been so unhappy that I have had difficulty sleeping. 0    I have felt sad or miserable. 0    I have been so unhappy that I have been crying. 0    The thought of harming myself has occurred to me. 0    Edinburgh Postnatal Depression Scale Total 5             Health Maintenance Due  Topic Date Due   COVID-19 Vaccine (1) Never done   Pneumococcal Vaccine 65-39 Years old (1 - PCV) Never done   Hepatitis C Screening  Never done   PAP SMEAR-Modifier  Never done   INFLUENZA VACCINE  03/21/2021    The following portions of the patient's history were reviewed and updated as appropriate: allergies, current  medications, past family history, past medical history, past social history, past surgical history, and problem list.  Review of Systems Pertinent items noted in HPI and remainder of comprehensive ROS otherwise negative.  Objective:  BP 124/76   Pulse (!) 59   Wt 233 lb (105.7 kg)   Breastfeeding Unknown   BMI 39.99 kg/m    General:  alert, cooperative, and appears stated age  Psych: Tearful on exam  Lungs: Normal effort  Heart:  regular rate and rhythm  Abdomen: soft, non-tender; bowel sounds normal; no masses,  no organomegaly        Assessment:   Postpartum exam.   Plan:   Essential components of care per ACOG recommendations:  1.  Mood and well being: Patient with negative depression screening today. Reviewed local resources for support.  - Patient tobacco use? No.   - hx of drug use? No.    2. Sexuality, contraception and birth spacing - Patient does not want a pregnancy in the next year.  Desired family size is 4 children.  - Reviewed forms of contraception in tiered fashion. Patient desired no method today. Husband has had a vasectomy and has not had 1st semen analysis since that time   3. Physical Recovery  - Discussed patients delivery and complications. She describes her  labor as mixed. - Patient had a Vaginal, no problems at delivery.  - Patient has urinary incontinence? Yes. Discussed role of pelvic floor PT. Offered PT and patient accepted. Patient was referred to pelvic floor PT.  Also referred to UroGYN given on-going incontinence since birth of last child - Patient is safe to resume physical and sexual activity  4.  Health Maintenance - HM due items addressed Yes - Last pap smear 10/29/19/- and normal Breast Cancer screening indicated? No.   5. Chronic Disease/Pregnancy Condition follow up: ADHD and depression/anxiety--offered treatment for depression and anxiety (patient declined at present), encouraged self care, IBH, mindfulness. Encouraged PCP for  treatment of ADHD. - PCP follow up  Reva Bores, MD Center for [facility], [facility]

## 2021-08-10 ENCOUNTER — Other Ambulatory Visit (HOSPITAL_COMMUNITY)
Admission: RE | Admit: 2021-08-10 | Discharge: 2021-08-10 | Disposition: A | Discharge status: Home / Self Care | Payer: BC Managed Care – PPO | Source: Ambulatory Visit | Attending: Obstetrics and Gynecology | Admitting: Obstetrics and Gynecology

## 2021-08-10 ENCOUNTER — Encounter: Payer: Self-pay | Admitting: Obstetrics and Gynecology

## 2021-08-10 ENCOUNTER — Other Ambulatory Visit: Payer: Self-pay

## 2021-08-10 ENCOUNTER — Ambulatory Visit (INDEPENDENT_AMBULATORY_CARE_PROVIDER_SITE_OTHER): Payer: BC Managed Care – PPO | Admitting: Obstetrics and Gynecology

## 2021-08-10 VITALS — BP 165/108 | HR 70 | Ht 64.0 in | Wt 230.0 lb

## 2021-08-10 DIAGNOSIS — N393 Stress incontinence (female) (male): Secondary | ICD-10-CM | POA: Diagnosis not present

## 2021-08-10 DIAGNOSIS — R350 Frequency of micturition: Secondary | ICD-10-CM | POA: Insufficient documentation

## 2021-08-10 DIAGNOSIS — N3941 Urge incontinence: Secondary | ICD-10-CM | POA: Diagnosis not present

## 2021-08-10 DIAGNOSIS — R310 Gross hematuria: Secondary | ICD-10-CM | POA: Insufficient documentation

## 2021-08-10 LAB — POCT URINALYSIS DIPSTICK
Appearance: NORMAL
Bilirubin, UA: NEGATIVE
Glucose, UA: NEGATIVE
Ketones, UA: NEGATIVE
Leukocytes, UA: NEGATIVE
Nitrite, UA: NEGATIVE
Protein, UA: POSITIVE — AB
Spec Grav, UA: >=1.03 — AB (ref 1.010–1.025)
Urobilinogen, UA: 0.2 E.U./dL
pH, UA: 6 (ref 5.0–8.0)

## 2021-08-10 LAB — URINALYSIS, ROUTINE W REFLEX MICROSCOPIC
Bacteria, UA: NONE SEEN
Bilirubin Urine: NEGATIVE
Glucose, UA: NEGATIVE mg/dL
Ketones, ur: NEGATIVE mg/dL
Leukocytes,Ua: NEGATIVE
Nitrite: NEGATIVE
Protein, ur: NEGATIVE mg/dL
RBC / HPF: >50 RBC/hpf — ABNORMAL HIGH (ref 0–5)
Specific Gravity, Urine: 1.019 (ref 1.005–1.030)
pH: 6 (ref 5.0–8.0)
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
## 2021-08-11 LAB — URINE CULTURE: Culture: NO GROWTH

## 2021-08-18 NOTE — Progress Notes (Signed)
[employer] health plan- approved Auth #:[id number] Approved through 08/18/2021- 09/16/2021

## 2021-08-23 ENCOUNTER — Ambulatory Visit (HOSPITAL_COMMUNITY)
Admission: RE | Admit: 2021-08-23 | Discharge: 2021-08-23 | Disposition: A | Discharge status: Home / Self Care | Payer: BC Managed Care – PPO | Source: Ambulatory Visit | Attending: Obstetrics and Gynecology | Admitting: Obstetrics and Gynecology

## 2021-08-23 ENCOUNTER — Other Ambulatory Visit: Payer: Self-pay

## 2021-08-23 ENCOUNTER — Encounter (HOSPITAL_COMMUNITY): Payer: Self-pay

## 2021-08-23 DIAGNOSIS — R310 Gross hematuria: Secondary | ICD-10-CM | POA: Diagnosis not present

## 2021-08-23 LAB — PREGNANCY, URINE: Preg Test, Ur: NEGATIVE

## 2021-08-23 MED ORDER — SODIUM CHLORIDE 0.9 % IV SOLN
INTRAVENOUS | Status: AC
  Filled 2021-08-23: qty 250

## 2021-08-23 MED ORDER — SODIUM CHLORIDE (PF) 0.9 % IJ SOLN
INTRAMUSCULAR | Status: AC
  Filled 2021-08-23: qty 50

## 2021-08-23 MED ORDER — IOHEXOL 300 MG/ML  SOLN
150.0000 mL | Freq: Once | INTRAMUSCULAR | Status: AC | PRN
  Administered 2021-08-23: 150 mL via INTRAVENOUS

## 2021-08-25 ENCOUNTER — Encounter: Payer: Self-pay | Admitting: Physical Therapy

## 2021-08-25 ENCOUNTER — Encounter: Payer: BC Managed Care – PPO | Attending: Family Medicine | Admitting: Physical Therapy

## 2021-08-25 ENCOUNTER — Other Ambulatory Visit: Payer: Self-pay

## 2021-08-25 DIAGNOSIS — N3946 Mixed incontinence: Secondary | ICD-10-CM | POA: Insufficient documentation

## 2021-08-25 DIAGNOSIS — R102 Pelvic and perineal pain: Secondary | ICD-10-CM | POA: Insufficient documentation

## 2021-08-25 DIAGNOSIS — R278 Other lack of coordination: Secondary | ICD-10-CM | POA: Diagnosis not present

## 2021-08-25 DIAGNOSIS — M6281 Muscle weakness (generalized): Secondary | ICD-10-CM | POA: Insufficient documentation

## 2021-08-25 NOTE — Therapy (Signed)
[facility] at [facility] [address], [address] [place], [place], [address] Phone: [phone]   Fax:  [phone]  Physical Therapy Evaluation  Patient Details  Name: BARBETTE MCGLAUN MRN: [id number] Date of Birth: 32-Sep-1992 Referring Provider (PT): Dr. Tinnie Gens   Encounter Date: 08/25/2021   PT End of Session - 08/25/21 1210     Visit Number 1    Date for PT Re-Evaluation 11/17/21    Authorization Type [employer]    Authorization - Visit Number 1    Authorization - Number of Visits 12    PT Start Time 1130    PT Stop Time 1225    PT Time Calculation (min) 55 min    Activity Tolerance Patient tolerated treatment well    Behavior During Therapy [facility] for tasks assessed/performed             Past Medical History:  Diagnosis Date   GERD (gastroesophageal reflux disease)    occasional, no meds, diet controlled   History of gestational hypertension    resoloved after c/s delivery   Pilonidal disease    W/ RECURRENT INFECTION   Postpartum anxiety 2019   Postpartum depression 2019   Seasonal asthma    Wears contact lenses     Past Surgical History:  Procedure Laterality Date   CESAREAN SECTION N/A 10/16/2017   Procedure: CESAREAN SECTION;  Surgeon: Harold Hedge, MD;  Location: [facility]IRTH[facility] SUITES;  Service: Obstetrics;  Laterality: N/A;   CESAREAN SECTION N/A 05/11/2020   Procedure: CESAREAN SECTION;  Surgeon: Candice Camp, MD;  [facility]: [facility] LD [facility]S;  Service: Obstetrics;  Laterality: N/A;   DILATION AND EVACUATION N/A 06/10/2019   Procedure: DILATATION AND EVACUATION;  Surgeon: Mitchel Honour, DO;  Location: [facility] OR;  Service: Gynecology;  Laterality: N/A;   PILONIDAL CYST EXCISION N/A 01/30/2018   Procedure: EXCISION PILONIDAL DISEASE EXTENSIVE ERAS PATHWAY;  Surgeon: Karie Soda, MD;  Location: [facility] [place];  Service: General;  Laterality: N/A;   WISDOM TOOTH EXTRACTION      There were no vitals filed for this  visit.    Subjective Assessment - 08/25/21 1139     Subjective Pateint reports she started to have incontinence with her second son. She pushed for 4.5 hours but then had a c-section. She had a catheter for 48 hours after birth and had no bladder control and continues for 3 months. Patient has had several UTI's. She is not leaking alot. Patient will leak urine right after she uses the bathroom. AFter her c-section she would have pain in the right side that would go to the back and to the left. Patient is still having the pain but it is less. Last september was [redacted] weeks pregnant she was having more urinary leakage but due to her water breaking. I saw the urologist and she said the pelvic floor was weak. CT scan came back negative.    Patient Stated Goals stregnthen the pelvic floor; reduce urinary leakage, reduce pain    Currently in Pain? 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 Tinnie Gens    Onset Date/Surgical Date 05/11/20    Prior Therapy none      Precautions   Precautions None      Restrictions   Weight Bearing Restrictions No      Balance Screen   Has the patient fallen in the past 6 months No    Has the patient had a decrease in activity level because of a fear of falling?  No    Is the patient reluctant to leave their home because of a fear of falling?  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   Leisure none      Cognition   Overall Cognitive Status Within Functional Limits for tasks assessed      Observation/Other Assessments   Skin Integrity c-section scar is limited in mobility    Focus on Therapeutic Outcomes (FOTO)  PFIQ-7 105; UIQ-7 67; POPIQ-7 38      Posture/Postural Control   Posture/Postural Control No significant limitations      ROM / Strength   AROM / PROM / Strength AROM;PROM;Strength      AROM   Lumbar Flexion full wiht tightness in the thoracolumbar munction    Lumbar - Left Side Bend decreased by 25%      Strength   Right Hip Extension 4+/5    Right Hip ABduction 4/5    Right Hip ADduction 4/5    Left Hip Extension 4-/5    Left Hip ABduction 3+/5    Left Hip ADduction 4/5      Flexibility   Soft Tissue Assessment /Muscle Length --   tightness in the hip adductors     Palpation   SI assessment  right ilium posteriorly rotated    Palpation comment tenderness located in bilateral lower quadrant and left suprapubically and below the umbilicus, bilateral quadratus                        Objective measurements completed on examination: See above findings.     Pelvic Floor Special Questions - 08/25/21 0001     Prior Pregnancies Yes    Number of Pregnancies 4    Number of C-Sections 2   2 miscarriages   Any difficulty with labor and deliveries Yes   last one pushed for 4 hours then had a c-section   Currently Sexually Active Yes    Is this Painful Yes   deep pain and on the right side   Urinary Leakage Yes    Pad use thin liners 3-4 per day; night 1 pad    Activities that cause leaking With strong urge;Coughing;Sneezing;Bending;Lifting;Laughing;Walking;Intercourse;Other    Other activities that cause leaking moving in the bed, just happens    Urinary urgency Yes    Urinary frequency urinates  every 1-1.5 hours during the day; night 2-3 times    Fecal incontinence No    Falling out feeling (prolapse) No  Skin Integrity Intact    Pelvic Floor Internal Exam Patient confirms identication and approves PT to assess pelvic floor and treatment    Exam Type Vaginal    Palpation tenderness located on bilateral levator ani and obturator internist  ight worse than left, cervix tender with decreased mobility on the right an dateriorly; tendernss located on right side of bladder    Strength weak squeeze, no lift                         PT Short Term Goals - 08/25/21 1256       PT SHORT TERM GOAL #1   Title independent with hip stretches and diaphragmatic breathing    Baseline not educated yet    Time 4    Period Weeks    Status New    Target Date 09/22/21      PT SHORT TERM GOAL #2   Title understand how to perform manual work to the c-section scar to improve mobiltiy    Baseline not educated yet    Time 4    Period Weeks    Status New    Target Date 09/22/21               PT Long Term Goals - 08/25/21 1258       PT LONG TERM GOAL #1   Title Independent with advanced HEP for core and pelvic floor strength    Baseline Not educated yet    Time 12    Period Weeks    Status New    Target Date 11/17/21      PT LONG TERM GOAL #2   Title able to have penetrative intercourse vaginally with pain level </= 1/10due to reduction of trigger points in the pelvic floor muscles    Baseline pain level 9/10    Time 12    Period Weeks    Status New    Target Date 11/17/21      PT LONG TERM GOAL #3   Title urinary leakage decreased so she is wearing 1 pad during the day for just in case and none at night due to pelvic floor strength >/= 3/5    Baseline pelvic floor strength 2/5 and wears 4-5 pads during the day and 1 at night due to reduction of urinary leakage    Time 12    Period Weeks    Status New    Target Date 11/17/21      PT LONG TERM GOAL #4    Title Patient will be able to play with her kids with pain level </= 2-3/10 cmopared to 9/10    Baseline pain levle 9/10    Time 12    Period Weeks    Status New    Target Date 11/17/21                    Plan - 08/25/21 1240     Clinical Impression Statement Patient is a 31 year old female with pelvic pain and Mixed incontinence since she had her last child on 05/11/2020. First child was a c-section. Last child she pushed for 4 hours and started to have right lower quadrant pain then had a c-section. Patient had a catheter in for 48 hours. After it was removed she did not have feeling and control with her bladder. Her right lower quadrant pain is a 9/10 happening at random times, when playing with her kids and  getting up from bed. Patient will have pain with penile penetration vaginally on the right deep side. She will leak urine randomly, walking, coughing, sneezing, laughing, lifting, bending forward, and during intercourse. She wears 1 pad at night and 4-5 during the day. She will void every 1-1.5 hourrs during the day and 2-3 at night. Pelvic floor strength is 2/5. She has tenderness located on the obturator internist and levator ani right > left, rightside of bladder, lower abdomen, bilateral lower quadrant, and bilateral quadratus. She has decreased mobility of the lumbar fascia and c-section scar. Right ilium is posterioly rotated. She has weakness in bilateral hips. Lumbar left sidebending is limited by 25%. She stands with increased thoracic kyphosis with decreased lower rib mobility. PFIQ-7 105, UIQ-7 67, CRAIQ-7 0, AND POPIQ-7 is 38. Patient will benefit from skilled therapy to reduce her pain and improve pelvic floor strength and coordinaiton.    Personal Factors and Comorbidities Comorbidity 2;Sex    Comorbidities C-section x2    Examination-Activity Limitations Sit;Bed Mobility;Caring for Others;Carry;Continence;Lift;Locomotion Level;Reach  Overhead;Toileting;Stand;Squat;Transfers    Examination-Participation Restrictions Cleaning;Community Activity;Driving;Shop;Interpersonal Relationship;Laundry;Meal Prep    Stability/Clinical Decision Making Evolving/Moderate complexity    Clinical Decision Making Moderate    Rehab Potential Excellent    PT Frequency 1x / week    PT Duration 12 weeks    PT Treatment/Interventions ADLs/Self Care Home Management;Biofeedback;Cryotherapy;Electrical Stimulation;Moist Heat;Ultrasound;Therapeutic activities;Therapeutic exercise;Neuromuscular re-education;Patient/family education;Manual techniques;Scar mobilization;Dry needling;Taping;Spinal Manipulations;Joint Manipulations    PT Next Visit Plan correct pelvis, manual work to c-section scar and educated her on it; diaphragmatic breathing, internal work to the pelvic floor, bladder and cervix, hip stretches for the pelvi cfloor, mobilization to thoracic for HEP; work on righ tinquinal Insurance account managercanal    Consulted and Agree with Plan of Care Patient             Patient will benefit from skilled therapeutic intervention in order to improve the following deficits and impairments:  Decreased endurance, Decreased scar mobility, Pain, Increased fascial restricitons, Decreased strength, Decreased activity tolerance, Decreased mobility, Increased muscle spasms, Decreased range of motion, Decreased coordination  Visit Diagnosis: Other lack of coordination - Plan: PT plan of care cert/re-cert  Muscle weakness (generalized) - Plan: PT plan of care cert/re-cert  Pelvic pain - Plan: PT plan of care cert/re-cert     Problem List Patient Active Problem List   Diagnosis Date Noted   Pilonidal disease 01/30/2018   Cigarette nicotine dependence without complication 03/03/2015    Eulis Fosterheryl Justis Closser, PT 08/25/21 2:09 PM   [facility] [address], [address] [place], [place], [address] Phone: [phone]   Fax:   [phone]  Name: Einar GipLeanna M Iannone MRN: [id number] Date of Birth: 08/12/1991

## 2021-09-01 ENCOUNTER — Encounter: Payer: Self-pay | Admitting: Physical Therapy

## 2021-09-01 ENCOUNTER — Other Ambulatory Visit: Payer: Self-pay

## 2021-09-01 ENCOUNTER — Encounter: Payer: BC Managed Care – PPO | Admitting: Physical Therapy

## 2021-09-01 DIAGNOSIS — R278 Other lack of coordination: Secondary | ICD-10-CM

## 2021-09-01 DIAGNOSIS — M6281 Muscle weakness (generalized): Secondary | ICD-10-CM

## 2021-09-01 DIAGNOSIS — N3946 Mixed incontinence: Secondary | ICD-10-CM | POA: Diagnosis not present

## 2021-09-01 DIAGNOSIS — R102 Pelvic and perineal pain: Secondary | ICD-10-CM

## 2021-09-01 NOTE — Patient Instructions (Signed)
Access Code: [email or web address] URL: [email or web address] Date: 09/01/2021 Prepared by: Eulis Foster  Exercises Supine Hamstring Stretch - 1 x daily - 3 x weekly - 1 sets - 2 reps - 30 sec hold Supine Piriformis Stretch Pulling Heel to Hip - 1 x daily - 3 x weekly - 1 sets - 2 reps - 30 sec hold Supine Double Knee to Chest - 1 x daily - 3 x weekly - 1 sets - 2 reps - 30 sec hold Warm Up Cat - 1 x daily - 3 x weekly - 1 sets - 10 reps Child's Pose Stretch - 1 x daily - 3 x weekly - 1 sets - 2 reps - 30 sec hold  Eulis Foster, PT [facility] [address], [address] [place], [place] [address] W: [phone] [email or web address]

## 2021-09-01 NOTE — Therapy (Signed)
[facility] at [facility] [address] [place], [place], [address] Phone: [phone]   Fax:  [phone]  Physical Therapy Treatment  Patient Details  Name: TANINA BARB MRN: [id number] Date of Birth: [birth year] Referring Provider (PT): Dr. Tinnie Gens   Encounter Date: 09/01/2021   PT End of Session - 09/01/21 1235     Visit Number 2    Date for PT Re-Evaluation 11/17/21    Authorization Type [employer]    Authorization Time Period 08/29/2021-10/28/2021    Authorization - Visit Number 2    Authorization - Number of Visits 4    PT Start Time 1130    PT Stop Time 1215    PT Time Calculation (min) 45 min    Activity Tolerance Patient tolerated treatment well    Behavior During Therapy [facility] for tasks assessed/performed             Past Medical History:  Diagnosis Date   GERD (gastroesophageal reflux disease)    occasional, no meds, diet controlled   History of gestational hypertension    resoloved after c/s delivery   Pilonidal disease    W/ RECURRENT INFECTION   Postpartum anxiety 2019   Postpartum depression 2019   Seasonal asthma    Wears contact lenses     Past Surgical History:  Procedure Laterality Date   CESAREAN SECTION N/A 10/16/2017   Procedure: CESAREAN SECTION;  Surgeon: Harold Hedge, MD;  Location: [facility];  Service: Obstetrics;  Laterality: N/A;   CESAREAN SECTION N/A 05/11/2020   Procedure: CESAREAN SECTION;  Surgeon: Candice Camp, MD;  Location: [facility];  Service: Obstetrics;  Laterality: N/A;   DILATION AND EVACUATION N/A 06/10/2019   Procedure: DILATATION AND EVACUATION;  Surgeon: Mitchel Honour, DO;  Location: [facility] OR;  Service: Gynecology;  Laterality: N/A;   PILONIDAL CYST EXCISION N/A 01/30/2018   Procedure: EXCISION PILONIDAL DISEASE EXTENSIVE ERAS PATHWAY;  Surgeon: Karie Soda, MD;  Location: [facility];  Service: General;  Laterality: N/A;   WISDOM TOOTH  EXTRACTION      There were no vitals filed for this visit.   Subjective Assessment - 09/01/21 1136     Subjective I feel like I was heard last sesion.    Patient Stated Goals stregnthen the pelvic floor; reduce urinary leakage, reduce pain    Currently in Pain? Yes    Pain Score 2     Pain Location Abdomen    Pain Orientation Right;Lower    Pain Descriptors / Indicators Contraction;Pressure    Pain Type Chronic pain    Pain Radiating Towards radiate to back and causes pressure    Pain Onset More than a month ago    Aggravating Factors  outside playing with kids, just happens, laying down and get up    Pain Relieving Factors breath and stay still    Multiple Pain Sites No                               [facility] Adult PT Treatment/Exercise - 09/01/21 0001       Lumbar Exercises: Stretches   Active Hamstring Stretch Right;Left;1 rep;30 seconds    Active Hamstring Stretch Limitations supine    Double Knee to Chest Stretch 1 rep;30 seconds    Double Knee to Chest Stretch Limitations knees apart    Quadruped Mid Back Stretch 1 rep;30 seconds    Quadruped Mid Back  Stretch Limitations childs pose    Piriformis Stretch Right;Left;1 rep;30 seconds    Piriformis Stretch Limitations supine    Other Lumbar Stretch Exercise Ha breath in sitting to bring rib cage downward      Lumbar Exercises: Quadruped   [place] 15 reps      Manual Therapy   Manual Therapy Myofascial release;Soft tissue mobilization    Manual therapy comments educated patient on how to perfrom manual work to her c-section scar    Soft tissue mobilization manual work to ther c-section scar; soft tissue work to the lumbar paraspinals, quadratus, and lower intercoastals; sitting releasl of the diaphragm    Myofascial Release using the suction cup to the patients thoracic and lumbar area; release of the suprapubic area in quaruped, release of the mesenteric root, release of the obliques in  quadruped                     PT Education - 09/01/21 1218     Education Details Access Code: [email or web address]; education on c-section scar massage; educatio on the HA breath in sitting    Person(s) Educated Patient    Methods Explanation;Demonstration;Verbal cues;Handout    Comprehension Verbalized understanding;Returned demonstration;Verbal cues required              PT Short Term Goals - 08/25/21 1256       PT SHORT TERM GOAL #1   Title independent with hip stretches and diaphragmatic breathing    Baseline not educated yet    Time 4    Period Weeks    Status New    Target Date 09/22/21      PT SHORT TERM GOAL #2   Title understand how to perform manual work to the c-section scar to improve mobiltiy    Baseline not educated yet    Time 4    Period Weeks    Status New    Target Date 09/22/21               PT Long Term Goals - 08/25/21 1258       PT LONG TERM GOAL #1   Title Independent with advanced HEP for core and pelvic floor strength    Baseline Not educated yet    Time 12    Period Weeks    Status New    Target Date 11/17/21      PT LONG TERM GOAL #2   Title able to have penetrative intercourse vaginally with pain level </= 1/10due to reduction of trigger points in the pelvic floor muscles    Baseline pain level 9/10    Time 12    Period Weeks    Status New    Target Date 11/17/21      PT LONG TERM GOAL #3   Title urinary leakage decreased so she is wearing 1 pad during the day for just in case and none at night due to pelvic floor strength >/= 3/5    Baseline pelvic floor strength 2/5 and wears 4-5 pads during the day and 1 at night due to reduction of urinary leakage    Time 12    Period Weeks    Status New    Target Date 11/17/21      PT LONG TERM GOAL #4   Title Patient will be able to play with her kids with pain level </= 2-3/10 cmopared to 9/10    Baseline pain levle 9/10    Time 12  Period Weeks    Status New    Target  Date 11/17/21                   Plan - 09/01/21 1220     Clinical Impression Statement Patient had fascial restrictions in the thoracic and lumbar area and abdominals. Patient diaphragm was moving better after manual work and she was able to open up her back rib cage. After therapy her stomach was smaller due to improved lymphatic system from the manual work. Patient continues to have tenderness in the lower abdomen and around the c-section scar. She was able to empty her bladder better after therapy. Patient will benefi tfrom skilled therapy to reduce her pain and improve pelvic floor strength and coordination.    Personal Factors and Comorbidities Comorbidity 2;Sex    Comorbidities C-section x2    Examination-Activity Limitations Sit;Bed Mobility;Caring for Others;Carry;Continence;Lift;Locomotion Level;Reach Overhead;Toileting;Stand;Squat;Transfers    Examination-Participation Restrictions Cleaning;Community Activity;Driving;Shop;Interpersonal Relationship;Laundry;Meal Prep    Stability/Clinical Decision Making Evolving/Moderate complexity    Rehab Potential Excellent    PT Frequency 1x / week    PT Duration 12 weeks    PT Treatment/Interventions ADLs/Self Care Home Management;Biofeedback;Cryotherapy;Electrical Stimulation;Moist Heat;Ultrasound;Therapeutic activities;Therapeutic exercise;Neuromuscular re-education;Patient/family education;Manual techniques;Scar mobilization;Dry needling;Taping;Spinal Manipulations;Joint Manipulations    PT Next Visit Plan correct pelvis, manual work to c-section scar and givehandout; diaphragmatic breathing, internal work to the pelvic floor, bladder and cervix,  mobilization to thoracic for HEP; work on right inquinal canal    PT Home Exercise Plan Access Code: [email or web address]    Recommended Other Services MD signed initial eval    Consulted and Agree with Plan of Care Patient             Patient will benefit from skilled therapeutic intervention in  order to improve the following deficits and impairments:  Decreased endurance, Decreased scar mobility, Pain, Increased fascial restricitons, Decreased strength, Decreased activity tolerance, Decreased mobility, Increased muscle spasms, Decreased range of motion, Decreased coordination  Visit Diagnosis: Other lack of coordination  Muscle weakness (generalized)  Pelvic pain     Problem List Patient Active Problem List   Diagnosis Date Noted   Pilonidal disease 01/30/2018   Cigarette nicotine dependence without complication 03/03/2015    Eulis Foster, PT 09/01/21 12:37 PM  [facility] [address], [address] [place], [place], [address] Phone: [phone]   Fax:  [phone]  Name: CARRYE GOLLER MRN: [id number] Date of Birth: [birth year]

## 2021-09-08 ENCOUNTER — Encounter: Payer: Self-pay | Admitting: Physical Therapy

## 2021-09-08 ENCOUNTER — Other Ambulatory Visit: Payer: Self-pay

## 2021-09-08 ENCOUNTER — Encounter: Payer: BC Managed Care – PPO | Admitting: Physical Therapy

## 2021-09-08 DIAGNOSIS — M6281 Muscle weakness (generalized): Secondary | ICD-10-CM

## 2021-09-08 DIAGNOSIS — N3946 Mixed incontinence: Secondary | ICD-10-CM | POA: Diagnosis not present

## 2021-09-08 DIAGNOSIS — R102 Pelvic and perineal pain: Secondary | ICD-10-CM

## 2021-09-08 DIAGNOSIS — R278 Other lack of coordination: Secondary | ICD-10-CM

## 2021-09-08 NOTE — Patient Instructions (Signed)
Access Code: [email or web address] URL: [email or web address] Date: 09/08/2021 Prepared by: Eulis Foster  Exercises Supine Transversus Abdominis Bracing - Hands on Stomach - 1 x daily - 7 x weekly - 1 sets - 10 reps  Eulis Foster, PT [facility] [address], [address] [place], [place] [address] W: [phone] [email or web address]

## 2021-09-08 NOTE — Therapy (Signed)
[facility] at [facility] [address] [place], [place], [address] Phone: [phone]   Fax:  [phone]  Physical Therapy Treatment  Patient Details  Name: Lisa Powers MRN: [id number] Date of Birth: March 13, 1991 Referring Provider (PT): Dr. Tinnie Gens   Encounter Date: 09/08/2021   PT End of Session - 09/08/21 1233     Visit Number 3    Date for PT Re-Evaluation 11/17/21    Authorization Type [employer]    Authorization Time Period 08/29/2021-10/28/2021    Authorization - Visit Number 3    Authorization - Number of Visits 4    PT Start Time 1130    PT Stop Time 1220    PT Time Calculation (min) 50 min    Activity Tolerance Patient tolerated treatment well    Behavior During Therapy [facility] for tasks assessed/performed             Past Medical History:  Diagnosis Date   GERD (gastroesophageal reflux disease)    occasional, no meds, diet controlled   History of gestational hypertension    resoloved after c/s delivery   Pilonidal disease    W/ RECURRENT INFECTION   Postpartum anxiety 2019   Postpartum depression 2019   Seasonal asthma    Wears contact lenses     Past Surgical History:  Procedure Laterality Date   CESAREAN SECTION N/A 10/16/2017   Procedure: CESAREAN SECTION;  Surgeon: Harold Hedge, MD;  Location: [facility] BIRTHING SUITES;  Service: Obstetrics;  Laterality: N/A;   CESAREAN SECTION N/A 05/11/2020   Procedure: CESAREAN SECTION;  Surgeon: Candice Camp, MD;  Location: [facility] LD ORS;  Service: Obstetrics;  Laterality: N/A;   DILATION AND EVACUATION N/A 06/10/2019   Procedure: DILATATION AND EVACUATION;  Surgeon: Mitchel Honour, DO;  Location: [facility] OR;  Service: Gynecology;  Laterality: N/A;   PILONIDAL CYST EXCISION N/A 01/30/2018   Procedure: EXCISION PILONIDAL DISEASE EXTENSIVE ERAS PATHWAY;  Surgeon: Karie Soda, MD;  Location: [facility];  Service: General;  Laterality: N/A;   WISDOM TOOTH  EXTRACTION      There were no vitals filed for this visit.   Subjective Assessment - 09/08/21 1132     Subjective I had less tension. Pateitn reports she has not had pain this week. I had true period since last visit and did not have a true period since my son.    Patient Stated Goals stregnthen the pelvic floor; reduce urinary leakage, reduce pain    Currently in Pain? No/denies                [facility] PT Assessment - 09/08/21 0001       Palpation   SI assessment  ASIS are equal                           [facility] Adult PT Treatment/Exercise - 09/08/21 0001       Therapeutic Activites    Therapeutic Activities ADL's    ADL's going from supine to sidely to sit with abdominal engagement      Lumbar Exercises: Supine   Ab Set 15 reps;5 seconds    AB Set Limitations wirh ball squeeze and breath to  contract the lower abdominals correctly      Lumbar Exercises: Sidelying   Other Sidelying Lumbar Exercises sidely transverse abdominus contraction with ball squeeze and breath 15x for 5 sec      Manual Therapy   Manual Therapy  Joint mobilization    Joint Mobilization Pa and rotational mobilization to T4-L5; gapping of facets of L3-L5 in sidely    Soft tissue mobilization soft tissue work along the c-section scar    Myofascial Release fascial release along the mesenteric root, along the inguinal canal, along the areas of the kidneys, along the upper abdominal area below the rib cage; release along the urachus and puboveisical ligament                     PT Education - 09/08/21 1222     Education Details Access Code: [email or web address]    Person(s) Educated Patient    Methods Explanation;Demonstration;Verbal cues;Handout    Comprehension Returned demonstration;Verbalized understanding              PT Short Term Goals - 09/08/21 1243       PT SHORT TERM GOAL #2   Title understand how to perform manual work to the c-section scar to improve mobiltiy     Time 4    Period Weeks    Status Achieved               PT Long Term Goals - 08/25/21 1258       PT LONG TERM GOAL #1   Title Independent with advanced HEP for core and pelvic floor strength    Baseline Not educated yet    Time 12    Period Weeks    Status New    Target Date 11/17/21      PT LONG TERM GOAL #2   Title able to have penetrative intercourse vaginally with pain level </= 1/10due to reduction of trigger points in the pelvic floor muscles    Baseline pain level 9/10    Time 12    Period Weeks    Status New    Target Date 11/17/21      PT LONG TERM GOAL #3   Title urinary leakage decreased so she is wearing 1 pad during the day for just in case and none at night due to pelvic floor strength >/= 3/5    Baseline pelvic floor strength 2/5 and wears 4-5 pads during the day and 1 at night due to reduction of urinary leakage    Time 12    Period Weeks    Status New    Target Date 11/17/21      PT LONG TERM GOAL #4   Title Patient will be able to play with her kids with pain level </= 2-3/10 cmopared to 9/10    Baseline pain levle 9/10    Time 12    Period Weeks    Status New    Target Date 11/17/21                   Plan - 09/08/21 1239     Clinical Impression Statement Patient reports she had her first period that lasted for 3 days. Prior to this they would last for 1 day since she had her son. Patient has not had pain since last visit. She is doing her c-section scar mobilization. Patient has increased softness of her abdomen since manual work. She is able to breath into her abdomen with greater ease to expand the pelvic floor. Patient needs tactile and manual cues to contract her lower abdominals correctly. Patient will benefit from skilled therapy to reduce her pain and improve pelvic floor strength and coordination.    Personal Factors and Comorbidities Comorbidity 2;Sex  Comorbidities C-section x2    Examination-Activity Limitations Sit;Bed  Mobility;Caring for Others;Carry;Continence;Lift;Locomotion Level;Reach Overhead;Toileting;Stand;Squat;Transfers    Examination-Participation Restrictions Cleaning;Community Activity;Driving;Shop;Interpersonal Relationship;Laundry;Meal Prep    Stability/Clinical Decision Making Evolving/Moderate complexity    Rehab Potential Excellent    PT Duration 12 weeks    PT Treatment/Interventions ADLs/Self Care Home Management;Biofeedback;Cryotherapy;Electrical Stimulation;Moist Heat;Ultrasound;Therapeutic activities;Therapeutic exercise;Neuromuscular re-education;Patient/family education;Manual techniques;Scar mobilization;Dry needling;Taping;Spinal Manipulations;Joint Manipulations    PT Next Visit Plan manual work to c-section scar, internal work to the pelvic floor, bladder and cervix,  mobilization to thoracic for HEP; work on right inquinal canal, advance abdominal exercise; need to put in [facility] auth    PT Home Exercise Plan Access Code: [email or web address]    Consulted and Agree with Plan of Care Patient             Patient will benefit from skilled therapeutic intervention in order to improve the following deficits and impairments:  Decreased endurance, Decreased scar mobility, Pain, Increased fascial restricitons, Decreased strength, Decreased activity tolerance, Decreased mobility, Increased muscle spasms, Decreased range of motion, Decreased coordination  Visit Diagnosis: Other lack of coordination  Muscle weakness (generalized)  Pelvic pain     Problem List Patient Active Problem List   Diagnosis Date Noted   Pilonidal disease 01/30/2018   Cigarette nicotine dependence without complication 03/03/2015    Eulis Foster, PT 09/08/21 12:44 PM  [place] [facility] at [facility] [address], [address] [place], [place], [address] Phone: [phone]   Fax:  [phone]  Name: MAHITHA HICKLING MRN: [id number] Date of Birth: October 23, 1990

## 2021-09-15 ENCOUNTER — Encounter: Payer: BC Managed Care – PPO | Admitting: Physical Therapy

## 2021-09-15 ENCOUNTER — Encounter: Payer: Self-pay | Admitting: Physical Therapy

## 2021-09-15 ENCOUNTER — Other Ambulatory Visit: Payer: Self-pay

## 2021-09-15 DIAGNOSIS — M6281 Muscle weakness (generalized): Secondary | ICD-10-CM

## 2021-09-15 DIAGNOSIS — N3946 Mixed incontinence: Secondary | ICD-10-CM | POA: Diagnosis not present

## 2021-09-15 DIAGNOSIS — R102 Pelvic and perineal pain: Secondary | ICD-10-CM

## 2021-09-15 DIAGNOSIS — R278 Other lack of coordination: Secondary | ICD-10-CM

## 2021-09-15 NOTE — Therapy (Signed)
[facility] at [facility] [address], [address] [place], [place], [address] Phone: [phone]   Fax:  [phone]  Physical Therapy Treatment  Patient Details  Name: ARDEL JAGGER MRN: [id number] Date of Birth: 11/01/1990 Referring Provider (PT): Dr. Tinnie Gens   Encounter Date: 09/15/2021   PT End of Session - 09/15/21 1221     Visit Number 4    Date for PT Re-Evaluation 11/17/21    Authorization Type [employer]    Authorization Time Period 08/29/2021-10/28/2021    Authorization - Visit Number 4    Authorization - Number of Visits 4    PT Start Time 1130    PT Stop Time 1210    PT Time Calculation (min) 40 min    Activity Tolerance Patient tolerated treatment well    Behavior During Therapy [facility] for tasks assessed/performed             Past Medical History:  Diagnosis Date   GERD (gastroesophageal reflux disease)    occasional, no meds, diet controlled   History of gestational hypertension    resoloved after c/s delivery   Pilonidal disease    W/ RECURRENT INFECTION   Postpartum anxiety 2019   Postpartum depression 2019   Seasonal asthma    Wears contact lenses     Past Surgical History:  Procedure Laterality Date   CESAREAN SECTION N/A 10/16/2017   Procedure: CESAREAN SECTION;  Surgeon: Harold Hedge, MD;  Location: [facility] BIRTHING SUITES;  Service: Obstetrics;  Laterality: N/A;   CESAREAN SECTION N/A 05/11/2020   Procedure: CESAREAN SECTION;  Surgeon: Candice Camp, MD;  Location: [facility];  Service: Obstetrics;  Laterality: N/A;   DILATION AND EVACUATION N/A 06/10/2019   Procedure: DILATATION AND EVACUATION;  Surgeon: Mitchel Honour, DO;  Location: [facility] OR;  Service: Gynecology;  Laterality: N/A;   PILONIDAL CYST EXCISION N/A 01/30/2018   Procedure: EXCISION PILONIDAL DISEASE EXTENSIVE ERAS PATHWAY;  Surgeon: Karie Soda, MD;  Location: [facility] [place];  Service: General;  Laterality: N/A;   WISDOM TOOTH  EXTRACTION      There were no vitals filed for this visit.   Subjective Assessment - 09/15/21 1137     Subjective I had more leakage this past week. Pain is 40% better. Pain occurs when she carries her son or physical activity.    Patient Stated Goals stregnthen the pelvic floor; reduce urinary leakage, reduce pain    Currently in Pain? 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   Leisure none      Cognition   Overall Cognitive Status Within Functional Limits for tasks assessed      Observation/Other Assessments   Skin Integrity c-section scar is limited in mobility    Focus on Therapeutic Outcomes (FOTO)  PFIQ-7 119; UIQ-7 71; POPIQ-7 48      AROM   Lumbar - Left Side Bend full lumbar ROM      Strength   Right Hip Extension 5/5    Right Hip ABduction 4+/5    Right Hip ADduction 5/5    Left Hip Extension 5/5    Left Hip ABduction 4+/5    Left Hip ADduction 5/5      Palpation   SI assessment  ASIS are equal    Palpation comment tenderness located in lower abdomen and around the umbilicus                         Pelvic Floor Special Questions - 09/15/21 0001     Number of Pregnancies 4    Number of C-Sections 2   2 miscarriages   Any difficulty with labor and deliveries Yes   last one pushed for 4 hours then had a c-section   Currently Sexually Active Yes    Is this Painful Yes   deep pain and on the right side   Urinary Leakage Yes    Pad use thin liners 3-4 per day; night 0  pad    Activities that cause leaking With strong urge;Coughing;Sneezing;Bending;Lifting;Laughing;Walking;Intercourse;Other    Other activities that cause leaking moving in the bed, just happens    Urinary frequency urinates every 2 hours during the day; night 2-3 times    Fecal incontinence No    Falling out feeling (prolapse) No    Skin Integrity Intact               [facility] Adult PT Treatment/Exercise - 09/15/21 0001       Manual Therapy   Manual Therapy Soft tissue mobilization;Myofascial release    Soft tissue mobilization scar massage to the c-section scarto release restrictions    Myofascial Release fascial release to the mesenteric root. a.ong the lateral abdominals, around the lower abdomen going through the different planes, releas of the intestines off the bladder                       PT Short Term Goals - 09/15/21 1229       PT SHORT TERM GOAL #1   Title independent with hip stretches and diaphragmatic breathing    Time 4    Period Weeks    Status Achieved      PT SHORT TERM GOAL #2   Title understand how to perform manual work to the c-section scar to improve mobiltiy    Time 4    Period Weeks    Status Achieved               PT Long Term Goals - 09/15/21 1230       PT LONG TERM GOAL #1   Title Independent with advanced HEP for core and pelvic floor strength    Baseline progressing as she improves    Time 12    Period Weeks    Status On-going  PT LONG TERM GOAL #2   Title able to have penetrative intercourse vaginally with pain level </= 1/10due to reduction  of trigger points in the pelvic floor muscles    Baseline pain level 9/10    Time 12    Period Weeks    Status On-going    Target Date 11/17/21      PT LONG TERM GOAL #3   Title urinary leakage decreased so she is wearing 1 pad during the day for just in case and none at night due to pelvic floor strength >/= 3/5    Baseline pelvic floor strength 2/5 and wears 2 pads during the day and 0 at night due to reduction of urinary leakage    Time 12    Period Weeks    Status On-going    Target Date 11/17/21      PT LONG TERM GOAL #4   Title Patient will be able to play with her kids with pain level </= 2-3/10 cmopared to 9/10    Baseline pain levle 7/10    Time 12    Period Weeks    Status On-going    Target Date 11/17/21                   Plan - 09/15/21 1224     Clinical Impression Statement Patient reports her pain has decreased 40% since last visit. She is not getting it as frequently but the intensity is 7/10. Patient continues to have pain with penile penetration vaginally. She will leak urine but not as often. She has increased bilateral hip strength. She will bulge her lower abdomen when contracting the abdominals. She isn ot wearing pantyliner at night now. She is wearinf 2 pantyliners during the day compared to 3. She is able to urinate every 2 hours instead of 1.5 hours. Patient pelvis is leveled. Patient will benefit from skilled therapy to reduce her pain, imporve pelvic floor strength and coordination.    Personal Factors and Comorbidities Comorbidity 2;Sex    Comorbidities C-section x2    Examination-Activity Limitations Sit;Bed Mobility;Caring for Others;Carry;Continence;Lift;Locomotion Level;Reach Overhead;Toileting;Stand;Squat;Transfers    Examination-Participation Restrictions Cleaning;Community Activity;Driving;Shop;Interpersonal Relationship;Laundry;Meal Prep    Stability/Clinical Decision Making Evolving/Moderate complexity    Rehab Potential Excellent    PT  Frequency 1x / week    PT Duration 8 weeks    PT Treatment/Interventions ADLs/Self Care Home Management;Biofeedback;Cryotherapy;Electrical Stimulation;Moist Heat;Ultrasound;Therapeutic activities;Therapeutic exercise;Neuromuscular re-education;Patient/family education;Manual techniques;Scar mobilization;Dry needling;Taping;Spinal Manipulations;Joint Manipulations    PT Next Visit Plan internal work to the pelvic floor, bladder and cervix,  lower abdominal contraction without bulging; ; work on right inquinal canal, advance abdominal exercise; check on [employer] auth    PT Home Exercise Plan Access Code: [email or web address]    Recommended Other Services [facility]    Consulted and Agree with Plan of Care Patient             Patient will benefit from skilled therapeutic intervention in order to improve the following deficits and impairments:  Decreased endurance, Decreased scar mobility, Pain, Increased fascial restricitons, Decreased strength, Decreased activity tolerance, Decreased mobility, Increased muscle spasms, Decreased range of motion, Decreased coordination  Visit Diagnosis: Other lack of coordination  Muscle weakness (generalized)  Pelvic pain     Problem List Patient Active Problem List   Diagnosis Date Noted   Pilonidal disease 01/30/2018   Cigarette nicotine dependence without complication 03/03/2015    Eulis Fosterheryl Tereka Thorley, PT 09/15/21 12:31 PM  [place] [facility] [address], [address] [place], [place], [address] Phone: [phone]   Fax:  [phone]  Name: Einar GipLeanna M Maceachern MRN: [id number] Date of Birth: 03/30/1991

## 2021-09-30 ENCOUNTER — Other Ambulatory Visit: Payer: BC Managed Care – PPO | Admitting: Obstetrics and Gynecology

## 2021-10-06 ENCOUNTER — Encounter: Payer: Self-pay | Admitting: Physical Therapy

## 2021-10-06 ENCOUNTER — Encounter: Payer: Medicaid Other | Attending: Obstetrics and Gynecology | Admitting: Physical Therapy

## 2021-10-06 ENCOUNTER — Other Ambulatory Visit: Payer: Self-pay

## 2021-10-06 DIAGNOSIS — R278 Other lack of coordination: Secondary | ICD-10-CM | POA: Diagnosis not present

## 2021-10-06 DIAGNOSIS — R102 Pelvic and perineal pain: Secondary | ICD-10-CM | POA: Diagnosis present

## 2021-10-06 DIAGNOSIS — M6281 Muscle weakness (generalized): Secondary | ICD-10-CM | POA: Diagnosis not present

## 2021-10-06 NOTE — Therapy (Signed)
[facility] at [facility] [address] [place], [place], [address] Phone: [phone]   Fax:  [phone]  Physical Therapy Treatment  Patient Details  Name: Lisa Powers MRN: [id number] Date of Birth: [birth year] Referring Provider (PT): Dr. Tinnie Gens   Encounter Date: 10/06/2021   PT End of Session - 10/06/21 1118     Visit Number 5    Date for PT Re-Evaluation 11/17/21    Authorization Type [employer]    Authorization Time Period 08/29/2021-10/28/2021    Authorization - Visit Number 4    Authorization - Number of Visits 4    PT Start Time 1030    PT Stop Time 1115    PT Time Calculation (min) 45 min    Activity Tolerance Patient tolerated treatment well    Behavior During Therapy [facility] for tasks assessed/performed             Past Medical History:  Diagnosis Date   GERD (gastroesophageal reflux disease)    occasional, no meds, diet controlled   History of gestational hypertension    resoloved after c/s delivery   Pilonidal disease    W/ RECURRENT INFECTION   Postpartum anxiety 2019   Postpartum depression 2019   Seasonal asthma    Wears contact lenses     Past Surgical History:  Procedure Laterality Date   CESAREAN SECTION N/A 10/16/2017   Procedure: CESAREAN SECTION;  Surgeon: Harold Hedge, MD;  Location: [facility] BIRTHING SUITES;  Service: Obstetrics;  Laterality: N/A;   CESAREAN SECTION N/A 05/11/2020   Procedure: CESAREAN SECTION;  Surgeon: Candice Camp, MD;  Location: [facility] LD ORS;  Service: Obstetrics;  Laterality: N/A;   DILATION AND EVACUATION N/A 06/10/2019   Procedure: DILATATION AND EVACUATION;  Surgeon: Mitchel Honour, DO;  Location: [facility] OR;  Service: Gynecology;  Laterality: N/A;   PILONIDAL CYST EXCISION N/A 01/30/2018   Procedure: EXCISION PILONIDAL DISEASE EXTENSIVE ERAS PATHWAY;  Surgeon: Karie Soda, MD;  Location: [facility];  Service: General;  Laterality: N/A;   WISDOM TOOTH  EXTRACTION      There were no vitals filed for this visit.   Subjective Assessment - 10/06/21 1037     Subjective I leak 1-2 days per week. I am not having any pain. I had blood in the urine two times. I have a urologist end of March for a scope. The duration of period is different. No pain with intercourse.    Patient Stated Goals stregnthen the pelvic floor; reduce urinary leakage, reduce pain    Currently in Pain? 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   Leisure none      Cognition   Overall Cognitive Status Within Functional Limits for tasks assessed      Observation/Other Assessments  Skin Integrity c-section scar is limited in mobility    Focus on Therapeutic Outcomes (FOTO)  PFIQ-7 119; UIQ-7 71; POPIQ-7 48      AROM   Lumbar - Left Side Bend full lumbar ROM      Strength   Right Hip Extension 5/5    Right Hip ABduction 4+/5    Right Hip ADduction 5/5    Left Hip Extension 5/5    Left Hip ABduction 4+/5    Left Hip ADduction 5/5      Palpation   SI assessment  ASIS are equal    Palpation comment tenderness located in lower abdomen and around the umbilicus                        Pelvic Floor Special Questions - 10/06/21 0001     Number of Pregnancies 4    Number of C-Sections 2   2 miscarriages   Any difficulty with labor and deliveries Yes   last one pushed for 4 hours then had a c-section   Currently Sexually Active Yes    Is this Painful No     Urinary Leakage Yes    Pad use no    Other activities that cause leaking after extra activity    Urinary frequency night time gets up 1-2 times per night    Fecal incontinence No    Falling out feeling (prolapse) No    Strength weak squeeze, no lift               [facility] Adult PT Treatment/Exercise - 10/06/21 0001       Self-Care   Self-Care Other Self-Care Comments    Other Self-Care Comments  Discussed with patient on keeping track of her cycle and blood in her urine to show the doctors what is happening      Lumbar Exercises: Supine   Ab Set 15 reps;5 seconds    AB Set Limitations wirh ball squeeze and breath to  contract the lower abdominals correctly    Bent Knee Raise 20 reps;1 second    Bent Knee Raise Limitations with red band around thighs    Bridge with [employer] 15 reps    Bridge with [employer] Limitations pelvic floor and abdominal contraction    Other Supine Lumbar Exercises long sitting tricep dip with abdominal contraction                     PT Education - 10/06/21 1118     Education Details Access Code: [email or web address]    Person(s) Educated Patient    Methods Explanation;Demonstration;Verbal cues;Handout    Comprehension Returned demonstration;Verbalized understanding              PT Short Term Goals - 09/15/21 1229       PT SHORT TERM GOAL #1   Title independent with hip stretches and diaphragmatic breathing    Time 4    Period Weeks    Status Achieved      PT SHORT TERM GOAL #2   Title understand how to perform manual work to the c-section scar to improve mobiltiy    Time 4    Period Weeks    Status Achieved               PT Long Term Goals - 10/06/21 1046       PT LONG TERM GOAL #1   Title Independent with advanced HEP for core and  pelvic floor strength    Baseline progressing as she improves    Time 12    Status On-going    Target Date 11/17/21      PT LONG TERM GOAL #2   Title able to have penetrative  intercourse vaginally with pain level </= 1/10due to reduction of trigger points in the pelvic floor muscles    Time 12    Period Weeks    Status Achieved      PT LONG TERM GOAL #3   Title urinary leakage decreased so she is wearing 1 pad during the day for just in case and none at night due to pelvic floor strength >/= 3/5    Baseline not wearing pads. 2 days out of the week leak after she does alot of physical stuff    Period Weeks    Status On-going    Target Date 11/17/21      PT LONG TERM GOAL #4   Title Patient will be able to play with her kids with pain level </= 2-3/10 cmopared to 9/10    Time 12    Period Weeks    Status Achieved                   Plan - 10/06/21 1119     Clinical Impression Statement Patient reports no pain with activity. She was able to have penile penetration without pain. She is leaking 2 days out of the week. She is working on her core strength now. She is able to contract her lower abdominals with her exericses. She has increased in bilateral hip strength. She is not wearing a panty liner compared to wearing 2-3 per day. Patient will benefit from skilled therapy to improve pelvic floor strength and coordination to reduce leakage.    Personal Factors and Comorbidities Comorbidity 2;Sex    Comorbidities C-section x2    Examination-Activity Limitations Sit;Bed Mobility;Caring for Others;Carry;Continence;Lift;Locomotion Level;Reach Overhead;Toileting;Stand;Squat;Transfers    Examination-Participation Restrictions Cleaning;Community Activity;Driving;Shop;Interpersonal Relationship;Laundry;Meal Prep    Stability/Clinical Decision Making Evolving/Moderate complexity    Rehab Potential Excellent    PT Frequency 1x / week    PT Duration 8 weeks    PT Treatment/Interventions ADLs/Self Care Home Management;Biofeedback;Cryotherapy;Electrical Stimulation;Moist Heat;Ultrasound;Therapeutic activities;Therapeutic exercise;Neuromuscular  re-education;Patient/family education;Manual techniques;Scar mobilization;Dry needling;Taping;Spinal Manipulations;Joint Manipulations    PT Next Visit Plan continue to progress her core exercises in quadruped and standing; check on well care auth    PT Home Exercise Plan Access Code: [email or web address]    Recommended Other Services put in well care auth 2/16    Consulted and Agree with Plan of Care Patient             Patient will benefit from skilled therapeutic intervention in order to improve the following deficits and impairments:  Decreased endurance, Decreased scar mobility, Pain, Increased fascial restricitons, Decreased strength, Decreased activity tolerance, Decreased mobility, Increased muscle spasms, Decreased range of motion, Decreased coordination  Visit Diagnosis: Other lack of coordination  Muscle weakness (generalized)  Pelvic pain     Problem List Patient Active Problem List   Diagnosis Date Noted   Pilonidal disease 01/30/2018   Cigarette nicotine dependence without complication 03/03/2015    Eulis Foster, PT 10/06/21 11:25 AM  [place] [facility] [address], [address] [place], [place], [address] Phone: [phone]   Fax:  [phone]  Name: Lisa Powers MRN: [id number] Date of Birth: September 28, 1990

## 2021-10-06 NOTE — Patient Instructions (Signed)
Access Code: [email or web address] URL: [email or web address] Date: 10/06/2021 Prepared by: Earlie Counts  Exercises Supine Hamstring Stretch - 1 x daily - 3 x weekly - 1 sets - 2 reps - 30 sec hold Supine Piriformis Stretch Pulling Heel to Hip - 1 x daily - 3 x weekly - 1 sets - 2 reps - 30 sec hold Supine Double Knee to Chest - 1 x daily - 3 x weekly - 1 sets - 2 reps - 30 sec hold Warm Up Cat - 1 x daily - 3 x weekly - 1 sets - 10 reps Child's Pose Stretch - 1 x daily - 3 x weekly - 1 sets - 2 reps - 30 sec hold Supine Transversus Abdominis Bracing - Hands on Stomach - 1 x daily - 7 x weekly - 1 sets - 10 reps Supine Bridge with Baby - 1 x daily - 3 x weekly - 1 sets - 15 reps Long Sitting Tricep Dip with Baby - 1 x daily - 3 x weekly - 1 sets - 10 reps Supine March - 1 x daily - 3 x weekly - 2 sets - 10 reps Clamshells with Baby - 1 x daily - 3 x weekly - 2 sets - 10 reps Earlie Counts, PT [facility] [address], [place], [place] [address] W: [phone] [email or web address]

## 2021-10-13 ENCOUNTER — Encounter: Payer: Medicaid Other | Admitting: Physical Therapy

## 2021-10-20 ENCOUNTER — Other Ambulatory Visit: Payer: Self-pay

## 2021-10-20 ENCOUNTER — Encounter: Payer: Medicaid Other | Attending: Family Medicine | Admitting: Physical Therapy

## 2021-10-20 DIAGNOSIS — R102 Pelvic and perineal pain: Secondary | ICD-10-CM | POA: Diagnosis present

## 2021-10-20 DIAGNOSIS — R278 Other lack of coordination: Secondary | ICD-10-CM | POA: Insufficient documentation

## 2021-10-20 DIAGNOSIS — M6281 Muscle weakness (generalized): Secondary | ICD-10-CM | POA: Insufficient documentation

## 2021-10-20 NOTE — Therapy (Signed)
[place] ?[facility] at [facility] ?[address], [address] ?[place], [place], [address] ?Phone: [phone]   Fax:  [phone] ? ?Physical Therapy Treatment ? ?Patient Details  ?Name: Lisa Powers ?MRN: [id number] ?Date of Birth: 07/14/1991 ?Referring Provider (PT): Dr. Tinnie Gens ? ? ?Encounter Date: 10/20/2021 ? ? PT End of Session - 10/20/21 1035   ? ? Visit Number 6   ? Date for PT Re-Evaluation 11/17/21   ? Authorization Type [employer]   ? Authorization Time Period 08/29/2021-11/05/2021   ? Authorization - Number of Visits 4   ? PT Start Time 1030   ? PT Stop Time 1115   ? PT Time Calculation (min) 45 min   ? Activity Tolerance Patient tolerated treatment well   ? Behavior During Therapy [facility] for tasks assessed/performed   ? ?  ?  ? ?  ? ? ?Past Medical History:  ?Diagnosis Date  ? GERD (gastroesophageal reflux disease)   ? occasional, no meds, diet controlled  ? History of gestational hypertension   ? resoloved after c/s delivery  ? Pilonidal disease   ? W/ RECURRENT INFECTION  ? Postpartum anxiety 2019  ? Postpartum depression 2019  ? Seasonal asthma   ? Wears contact lenses   ? ? ?Past Surgical History:  ?Procedure Laterality Date  ? CESAREAN SECTION N/A 10/16/2017  ? Procedure: CESAREAN SECTION;  Surgeon: Harold Hedge, MD;  Location: [facility] BIRTHING SUITES;  Service: Obstetrics;  Laterality: N/A;  ? CESAREAN SECTION N/A 05/11/2020  ? Procedure: CESAREAN SECTION;  Surgeon: Candice Camp, MD;  Location: [facility] LD ORS;  Service: Obstetrics;  Laterality: N/A;  ? DILATION AND EVACUATION N/A 06/10/2019  ? Procedure: DILATATION AND EVACUATION;  Surgeon: Mitchel Honour, DO;  Location: [facility] OR;  Service: Gynecology;  Laterality: N/A;  ? PILONIDAL CYST EXCISION N/A 01/30/2018  ? Procedure: EXCISION PILONIDAL DISEASE EXTENSIVE ERAS PATHWAY;  Surgeon: Karie Soda, MD;  Location: [facility];  Service: General;  Laterality: N/A;  ? WISDOM TOOTH EXTRACTION    ? ? ?There were no vitals filed  for this visit. ? ? Subjective Assessment - 10/20/21 1036   ? ? Subjective I have been doing the exercises. I had one pain when I coughed at my incision area.  I will leak after cleaning and playing with kids. Leakage is not continuing through the day. I feel fulllness in the bladder so I feel like I have to urinate. NO urinary leakage at night.   ? Patient Stated Goals stregnthen the pelvic floor; reduce urinary leakage, reduce pain   ? Currently in Pain? No/denies   ? ?  ?  ? ?  ? ? ? ? ? ? ? ? ? ? ? ? ? ? ? ? ? Pelvic Floor Special Questions - 10/20/21 0001   ? ? Pad use no   ? ?  ?  ? ?  ? ? ? ? [facility] Adult PT Treatment/Exercise - 10/20/21 0001   ? ?  ? Lumbar Exercises: Supine  ? Bent Knee Raise 20 reps;1 second   ? Bent Knee Raise Limitations with abdominal contraction   ? Bridge 15 reps   ? Bridge Limitations with abdominal contraction   ? Other Supine Lumbar Exercises long sitting tricep dip with abdominal contraction   ?  ? Lumbar Exercises: Sidelying  ? Other Sidelying Lumbar Exercises abdominal brascing with breath 10x each side   ?  ? Lumbar Exercises: Quadruped  ? Other Quadruped Lumbar Exercises abdominal  contraction with breath 10x   ?  ? Manual Therapy  ? Manual Therapy Soft tissue mobilization;Myofascial release   ? Soft tissue mobilization manual work to the diaphragm and obliques in supine and sidely   ? Myofascial Release using suction cup to the upper abdomen and c-section scar for tissue mobility, tissue rolling of the upper abdomen to release the fascia   ? ?  ?  ? ?  ? ? ? ? ? ? ? ? ? ? ? ? PT Short Term Goals - 09/15/21 1229   ? ?  ? PT SHORT TERM GOAL #1  ? Title independent with hip stretches and diaphragmatic breathing   ? Time 4   ? Period Weeks   ? Status Achieved   ?  ? PT SHORT TERM GOAL #2  ? Title understand how to perform manual work to the c-section scar to improve mobiltiy   ? Time 4   ? Period Weeks   ? Status Achieved   ? ?  ?  ? ?  ? ? ? ? PT Long Term Goals - 10/20/21 1125    ? ?  ? PT LONG TERM GOAL #1  ? Title Independent with advanced HEP for core and pelvic floor strength   ? Baseline progressing as she improves   ? Time 12   ? Period Weeks   ? Status On-going   ?  ? PT LONG TERM GOAL #2  ? Title able to have penetrative intercourse vaginally with pain level </= 1/10due to reduction of trigger points in the pelvic floor muscles   ? Time 12   ? Period Weeks   ? Status Achieved   ?  ? PT LONG TERM GOAL #3  ? Title urinary leakage decreased so she is wearing 1 pad during the day for just in case and none at night due to pelvic floor strength >/= 3/5   ? Baseline not wearing a pad   ? Time 12   ? Period Weeks   ? Status On-going   ?  ? PT LONG TERM GOAL #4  ? Title Patient will be able to play with her kids with pain level </= 2-3/10 cmopared to 9/10   ? Time 12   ? Period Weeks   ? Status Achieved   ? ?  ?  ? ?  ? ? ? ? ? ? ? ? Plan - 10/20/21 1122   ? ? Clinical Impression Statement Patient was able to contract the abdomen better after the manual work and feel the pelvic floor contract. She was able to not bulge the abdomen with contraction and perform in sidely and quadruped. Patient reports she is not able to feel a full bladder in the morning. She is not leaking urine at night and continously during the day. She is not wearing a pad and has confidence to go out. She is not having the pain. Patient will benefit from skilled therapy to improve pelvic floor strength and coordination to reduce leakage.   ? Personal Factors and Comorbidities Comorbidity 2;Sex   ? Comorbidities C-section x2   ? Examination-Activity Limitations Sit;Bed Mobility;Caring for Others;Carry;Continence;Lift;Locomotion Level;Reach Overhead;Toileting;Stand;Squat;Transfers   ? Examination-Participation Restrictions Cleaning;Community Activity;Driving;Shop;Interpersonal Relationship;Laundry;Meal Prep   ? Stability/Clinical Decision Making Evolving/Moderate complexity   ? Rehab Potential Excellent   ? PT Frequency 1x  / week   ? PT Duration 8 weeks   ? PT Treatment/Interventions ADLs/Self Care Home Management;Biofeedback;Cryotherapy;Electrical Stimulation;Moist Heat;Ultrasound;Therapeutic activities;Therapeutic exercise;Neuromuscular re-education;Patient/family education;Manual techniques;Scar  mobilization;Dry needling;Taping;Spinal Manipulations;Joint Manipulations   ? PT Next Visit Plan continue to progress her core exercises in quadruped and standing; if doing well then discharge   ? PT Home Exercise Plan Access Code: [email or web address]   ? Consulted and Agree with Plan of Care Patient   ? ?  ?  ? ?  ? ? ?Patient will benefit from skilled therapeutic intervention in order to improve the following deficits and impairments:  Decreased endurance, Decreased scar mobility, Pain, Increased fascial restricitons, Decreased strength, Decreased activity tolerance, Decreased mobility, Increased muscle spasms, Decreased range of motion, Decreased coordination ? ?Visit Diagnosis: ?Other lack of coordination ? ?Muscle weakness (generalized) ? ? ? ? ?Problem List ?Patient Active Problem List  ? Diagnosis Date Noted  ? Pilonidal disease 01/30/2018  ? Cigarette nicotine dependence without complication 03/03/2015  ? ? ?Eulis Foster, PT ?10/20/21 11:27 AM ? ? ?[place] ?Outpatient Rehabilitation at [facility] ?[address], [address] ?[place], [place], [address] ?Phone: [phone]   Fax:  [phone] ? ?Name: Lisa Powers ?MRN: [id number] ?Date of Birth: 1991/04/27 ? ? ? ?

## 2021-10-27 ENCOUNTER — Encounter: Payer: Self-pay | Admitting: Physical Therapy

## 2021-10-27 ENCOUNTER — Encounter: Payer: Medicaid Other | Admitting: Physical Therapy

## 2021-10-27 ENCOUNTER — Other Ambulatory Visit: Payer: Self-pay

## 2021-10-27 DIAGNOSIS — R278 Other lack of coordination: Secondary | ICD-10-CM

## 2021-10-27 DIAGNOSIS — M6281 Muscle weakness (generalized): Secondary | ICD-10-CM

## 2021-10-27 DIAGNOSIS — R102 Pelvic and perineal pain: Secondary | ICD-10-CM

## 2021-10-27 NOTE — Therapy (Signed)
[place] at [facility] [address], [place], [place], [address] Phone: [phone]   Fax:  [phone]  Physical Therapy Treatment  Patient Details  Name: Lisa Powers MRN: [id number] Date of Birth: [birth year] Referring Provider (PT): Dr. Darron Doom   Encounter Date: 10/27/2021   PT End of Session - 10/27/21 1031     Visit Number 7    Date for PT Re-Evaluation 11/17/21    Authorization Type [employer]    Authorization Time Period 08/29/2021-11/05/2021    PT Start Time 1031    PT Stop Time 1115    PT Time Calculation (min) 44 min    Activity Tolerance Patient tolerated treatment well    Behavior During Therapy [facility] for tasks assessed/performed             Past Medical History:  Diagnosis Date   GERD (gastroesophageal reflux disease)    occasional, no meds, diet controlled   History of gestational hypertension    resoloved after c/s delivery   Pilonidal disease    W/ RECURRENT INFECTION   Postpartum anxiety 2019   Postpartum depression 2019   Seasonal asthma    Wears contact lenses     Past Surgical History:  Procedure Laterality Date   CESAREAN SECTION N/A 10/16/2017   Procedure: CESAREAN SECTION;  Surgeon: Everlene Farrier, MD;  Location: [place];  Service: Obstetrics;  Laterality: N/A;   CESAREAN SECTION N/A 05/11/2020   Procedure: CESAREAN SECTION;  Surgeon: Louretta Shorten, MD;  Location: [place] LD ORS;  Service: Obstetrics;  Laterality: N/A;   DILATION AND EVACUATION N/A 06/10/2019   Procedure: DILATATION AND EVACUATION;  Surgeon: [name], DO;  Location: [place];  Service: Gynecology;  Laterality: N/A;   PILONIDAL CYST EXCISION N/A 01/30/2018   Procedure: EXCISION PILONIDAL DISEASE EXTENSIVE ERAS PATHWAY;  Surgeon: Michael Boston, MD;  Location: [place];  Service: General;  Laterality: N/A;   WISDOM TOOTH EXTRACTION      There were no vitals filed for this visit.   Subjective  Assessment - 10/27/21 1032     Subjective I am doing good. I had leakage 1 time this week and after using the rest room and stood up. I have been feeling the muscle engage while holding my urine.    Patient Stated Goals stregnthen the pelvic floor; reduce urinary leakage, reduce pain    Currently in Pain? 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   Leisure none      Cognition   Overall Cognitive Status Within Functional Limits for tasks assessed      AROM   Lumbar - Left Side Bend full lumbar ROM      Strength   Right Hip Extension 5/5    Right Hip ABduction 5/5    Right Hip ADduction 5/5  Left Hip Extension 5/5    Left Hip ABduction 5/5    Left Hip ADduction 5/5      Palpation   SI assessment  ASIS are equal                        Pelvic Floor Special Questions - 10/27/21 0001     Currently Sexually Active Yes    Is this Painful No    Pad use no    Exam Type Deferred               [facility] Adult PT Treatment/Exercise - 10/27/21 0001       Knee/Hip Exercises: Standing   Heel Raises Right;Left;1 set;10 reps    Forward Lunges Right;Left;1 set;5 reps    Forward Lunges Limitations contract the apbdominals, hand support, pelvic floro contraction    Functional Squat 15 reps    Functional Squat Limitations with pelvic floor and abdominal contraction    Other Standing Knee Exercises wall plank hold for 15 seconds then side plank hold 15 sec each side;    Other Standing Knee Exercises  single leg dead lift 5 times each side; singl eleg squat with high mat then did with wall to lean on; jumping with pelvic floro contraction 10x                     PT Education - 10/27/21 1117     Education Details Access Code: [email or web address]    Person(s) Educated Patient    Methods Explanation;Demonstration;Handout    Comprehension Returned demonstration;Verbalized understanding              PT Short Term Goals - 10/27/21 1036       PT SHORT TERM GOAL #1   Title independent with hip stretches and diaphragmatic breathing    Baseline --    Time 4    Period Weeks    Status Achieved    Target Date 09/22/21      PT SHORT TERM GOAL #2   Title understand how to perform manual work to the c-section scar to improve mobiltiy    Baseline --    Time 4    Period Weeks    Status Achieved    Target Date 09/22/21               PT Long Term Goals - 10/27/21 1036       PT LONG TERM GOAL #1   Title Independent with advanced HEP for core and pelvic floor strength    Time 12    Period Weeks    Status Achieved    Target Date 11/17/21      PT LONG TERM GOAL #2   Title able to have penetrative intercourse vaginally with pain level </= 1/10due to reduction of trigger points in the pelvic floor muscles    Baseline --    Time 12    Period Weeks    Status Achieved      PT LONG TERM GOAL #3   Title urinary leakage decreased so she is wearing 1 pad during the day for just in case and none at night due to pelvic floor strength >/= 3/5    Time 12    Period Weeks    Status Achieved    Target Date 11/17/21      PT LONG TERM GOAL #4   Title Patient will be able to play with her kids with pain level </=  2-3/10 cmopared to 9/10    Time 12    Period Weeks    Status Achieved                   Plan - 10/27/21 1122     Clinical Impression Statement Patient is not wearing a pad. Bilateral hip strength is 5/5. She is able to engage her lower abdominals correctly and  feel the pelvic floor contract. She leaked on time when she stood up from the commode. She is able to have penile penetration vaginally without pain. She was educated on how to progress her exercises and incorporate them into the day with her children to ccontinue wiht her strengthening. She ahs good mobility of the c-section scar. she is able to play with her children without pain. Patinet had pain one time for 1 minute. Patient is independent with her HEP and ready for discharge.    Personal Factors and Comorbidities Comorbidity 2;Sex    Comorbidities C-section x2    Examination-Activity Limitations Sit;Bed Mobility;Caring for Others;Carry;Continence;Lift;Locomotion Level;Reach Overhead;Toileting;Stand;Squat;Transfers    Examination-Participation Restrictions Cleaning;Community Activity;Driving;Shop;Interpersonal Relationship;Laundry;Meal Prep    Stability/Clinical Decision Making Evolving/Moderate complexity    Rehab Potential Excellent    PT Treatment/Interventions ADLs/Self Care Home Management;Biofeedback;Cryotherapy;Electrical Stimulation;Moist Heat;Ultrasound;Therapeutic activities;Therapeutic exercise;Neuromuscular re-education;Patient/family education;Manual techniques;Scar mobilization;Dry needling;Taping;Spinal Manipulations;Joint Manipulations    PT Next Visit Plan Discharge to HEP    PT Home Exercise Plan Access Code: [email or web address]    Consulted and Agree with Plan of Care Patient             Patient will benefit from skilled therapeutic intervention in order to improve the following deficits and impairments:  Decreased endurance, Decreased scar mobility, Pain, Increased fascial restricitons, Decreased strength, Decreased activity tolerance, Decreased mobility, Increased muscle spasms, Decreased range of motion, Decreased coordination  Visit Diagnosis: Other lack of coordination  Muscle weakness (generalized)  Pelvic pain     Problem List Patient Active Problem List    Diagnosis Date Noted   Pilonidal disease 01/30/2018   Cigarette nicotine dependence without complication 83/29/1916    Earlie Counts, PT 10/27/21 11:27 AM  [place] at [facility] [address], [place], [place], [address] Phone: [phone]   Fax:  [phone]  Name: Lisa Powers MRN: [id number] Date of Birth: 1991/07/26 PHYSICAL THERAPY DISCHARGE SUMMARY  Visits from Start of Care: 7  Current functional level related to goals / functional outcomes: See above.   Remaining deficits: See above.    Education / Equipment: HEP   Patient agrees to discharge. Patient goals were met. Patient is being discharged due to meeting the stated rehab goals. Thank you for the referral. Earlie Counts, PT 10/27/21 11:27 AM

## 2021-10-27 NOTE — Patient Instructions (Signed)
Access Code: [email or web address] ?URL: [email or web address] ?Date: 10/27/2021 ?Prepared by: Earlie Counts ? ?Program Notes ?[email or web address] ? ? ?Exercises ?Supine Hamstring Stretch - 1 x daily - 3 x weekly - 1 sets - 2 reps - 30 sec hold ?Supine Piriformis Stretch Pulling Heel to Hip - 1 x daily - 3 x weekly - 1 sets - 2 reps - 30 sec hold ?Supine Double Knee to Chest - 1 x daily - 3 x weekly - 1 sets - 2 reps - 30 sec hold ?Warm Up Cat - 1 x daily - 3 x weekly - 1 sets - 10 reps ?Child's Pose Stretch - 1 x daily - 3 x weekly - 1 sets - 2 reps - 30 sec hold ?Supine Transversus Abdominis Bracing - Hands on Stomach - 1 x daily - 7 x weekly - 1 sets - 10 reps ?Supine Bridge with Baby - 1 x daily - 3 x weekly - 1 sets - 15 reps ?Long Sitting Tricep Dip with Baby - 1 x daily - 3 x weekly - 1 sets - 10 reps ?Supine March - 1 x daily - 3 x weekly - 2 sets - 10 reps ?Clamshells with Baby - 1 x daily - 3 x weekly - 2 sets - 10 reps ?Mini Squat with Pelvic Floor Contraction - 1 x daily - 3 x weekly - 1 sets - 10 reps ?Lunge with Counter Support - 1 x daily - 3 x weekly - 1 sets - 5 reps ?Single Leg Mini Squat - 1 x daily - 3 x weekly - 1 sets - 5 reps ?Single Leg Heel Raise - 1 x daily - 3 x weekly - 1 sets - 10 reps ?Forward T - 1 x daily - 3 x weekly - 1 sets - 5 reps ?Jumping with Pelvic Floor Contraction - 1 x daily - 1 x weekly - 1 sets - 10 reps ?Standing Plank on Wall - 1 x daily - 3 x weekly - 1 sets - 10 reps ?Standing Full Side Plank on Wall - 1 x daily - 3 x weekly - 1 sets - 10 reps ?Earlie Counts, PT ?[facility] ?[address], [address] ?[place], [place] [address] ?W: [phone] ?[email or web address] ? ?

## 2021-11-03 ENCOUNTER — Encounter: Payer: Medicaid Other | Admitting: Physical Therapy

## 2021-11-11 ENCOUNTER — Ambulatory Visit (INDEPENDENT_AMBULATORY_CARE_PROVIDER_SITE_OTHER): Payer: Medicaid Other | Admitting: Obstetrics and Gynecology

## 2021-11-11 ENCOUNTER — Encounter: Payer: Self-pay | Admitting: Obstetrics and Gynecology

## 2021-11-11 ENCOUNTER — Other Ambulatory Visit: Payer: Self-pay

## 2021-11-11 VITALS — BP 115/76 | HR 103

## 2021-11-11 DIAGNOSIS — N393 Stress incontinence (female) (male): Secondary | ICD-10-CM | POA: Diagnosis not present

## 2021-11-11 DIAGNOSIS — R310 Gross hematuria: Secondary | ICD-10-CM

## 2021-11-11 DIAGNOSIS — R350 Frequency of micturition: Secondary | ICD-10-CM | POA: Diagnosis not present

## 2021-11-11 LAB — POCT URINALYSIS DIPSTICK
Appearance: NORMAL
Bilirubin, UA: NEGATIVE
Blood, UA: NEGATIVE
Glucose, UA: NEGATIVE
Ketones, UA: NEGATIVE
Leukocytes, UA: NEGATIVE
Nitrite, UA: NEGATIVE
Protein, UA: POSITIVE — AB
Spec Grav, UA: 1.02 (ref 1.010–1.025)
Urobilinogen, UA: 0.2 E.U./dL
pH, UA: 7.5 (ref 5.0–8.0)

## 2021-11-11 NOTE — Progress Notes (Signed)
[facility] ?Return Visit ? ?SUBJECTIVE  ?History of Present Illness: ?Lisa Powers is a 31 y.o. female seen in follow-up for incontinence and gross hematuria.  ? ?She has been attending physical therapy and she has seen a lot of improvement with her leakage and urgency. Still has some leakage with movement.  ? ?She is here today for cystoscopy for gross hematuria. Blood is seen prior to her period. Does not experience any pain with bleeding. We reviewed that her CT scan was normal.  ? ?Past Medical History: ?Patient  has a past medical history of GERD (gastroesophageal reflux disease), History of gestational hypertension, Pilonidal disease, Postpartum anxiety (2019), Postpartum depression (2019), Seasonal asthma, and Wears contact lenses.  ? ?Past Surgical History: ?She  has a past surgical history that includes Cesarean section (N/A, 10/16/2017); Pilonidal cyst excision (N/A, 01/30/2018); Wisdom tooth extraction; Dilation and evacuation (N/A, 06/10/2019); and Cesarean section (N/A, 05/11/2020).  ? ?Medications: ?She has a current medication list which includes the following prescription(s): albuterol and ibuprofen.  ? ?Allergies: ?Patient has No Known Allergies.  ? ?Social History: ?Patient  reports that she quit smoking about 2 years ago. Her smoking use included cigarettes. She smoked an average of .25 packs per day. She has never used smokeless tobacco. She reports that she does not drink alcohol and does not use drugs.  ?  ?  ?OBJECTIVE  ?  ? ?Physical Exam: ?Vitals:  ? 11/11/21 0827  ?BP: 115/76  ?Pulse: (!) 103  ? ?Gen: No apparent distress, A&O x 3. ? ?Detailed Urogynecologic Evaluation:  ?Deferred. ?  ?CYSTOSCOPY ? ?CC:  This is a 31 y.o. with gross hematuria who presents today for cystoscopy. ? ?POC urine: positive protein, otherwise negative ? ?BP 115/76   Pulse (!) 103   LMP 10/19/2021  ? ?CYSTOSCOPY: ?A time out was performed.  The periurethral area was prepped and draped in a sterile  manner.  2% lidocaine jetpack was inserted at the urethral meatus and the urethra and bladder visualized with 0- and 70-degree scopes.  She had normal urethral coaptation and normal urethral mucosa.  She had normal bladder mucosa. She had bilateral clear efflux from both ureteral orifices.  She had no squamous metaplasia at the trigone, no trabeculations, cellules or diverticuli.   ? ?She also had a CT A/P w/wo contrast on 08/23/21: ?IMPRESSION: ?1. No hydronephrosis. No renal, ureteral or bladder calculi ?identified. ?2. No solid enhancing renal mass. ? ?ASSESSMENT:  ?31 y.o. with gross hematuria. Cystoscopy today is normal. ? ?ASSESSMENT AND PLAN  ?  ?Ms. Lisa Powers is a 31 y.o. with:  ?1. SUI (stress urinary incontinence, female)   ?2. Urinary frequency   ?3. Gross hematuria   ? ?Gross hematuria ?- workup has been negative ?- We discussed coming for straight cath urine sample when she is experiencing seeing the blood to ensure it is not coming from the vagina (as it is close to her period).  ? ?2. SUI ?- Reviewed options of a pessary, midurethral sling, or transurethral injection of a bulking agent. She is interested in continuing with pelvic PT exercises at this time.  ? ?3. Urge incontinence ?- improved with PT ? ?Marguerita Beards, MD ? ?Time spent: I spent 25 minutes dedicated to the care of this patient on the date of this encounter to include pre-visit review of records, face-to-face time with the patient and post visit documentation. Additional time was spent on the procedure.  ? ?

## 2021-11-11 NOTE — Patient Instructions (Signed)
Taking Care of Yourself after Cystoscopy  Drink plenty of water for a day or two following your procedure. Try to have about 8 ounces (one cup) at a time, and do this 6 times or more per day unless you have fluid restrictitons AVOID irritative beverages such as coffee, tea, soda, alcoholic or citrus drinks for a day or two, as this may cause burning with urination.  For the first 1-2 days after the procedure, your urine may be pink or red in color. You may have some blood in your urine as a normal side effect of the procedure. Large amounts of bleeding or difficulty urinating are NOT normal. Call the nurse line if this happens or go to the nearest Emergency Room if the bleeding is heavy or you cannot urinate at all and it is after hours.  You may experience some discomfort or a burning sensation with urination after having this procedure. You can use over the counter Azo or pyridium to help with burning and follow the instructions on the packaging. If it does not improve within 1-2 days, or other symptoms appear (fever, chills, or difficulty urinating) call the office to speak to a nurse.  You may return to normal daily activities such as work, school, driving, exercising and housework on the day of the procedure.   

## 2022-06-12 ENCOUNTER — Encounter: Payer: Self-pay | Admitting: *Deleted

## 2023-07-09 IMAGING — CT CT ABD-PEL WO/W CM
3 of 8 series · 12 of 46 positions shown, 18 images · IV contrast (omnipaque)
Comparison: None.

CLINICAL DATA: Gross hematuria x4 months. Pelvic pain x1 year.
Bladder incontinence with bladder pain and leakage.

EXAM:
CT ABDOMEN AND PELVIS WITHOUT AND WITH CONTRAST
TECHNIQUE: Multidetector CT imaging of the abdomen and pelvis was performed
following the standard protocol before and following the bolus
administration of intravenous contrast.
CONTRAST:  150mL OMNIPAQUE IOHEXOL 300 MG/ML  SOLN

[Series 2: axial pre · axial · non-contrast · 0.71mm/px · z∈[-400,-340]mm · 2 of 96 slices shown]
[im 12/96  soft-tissue]
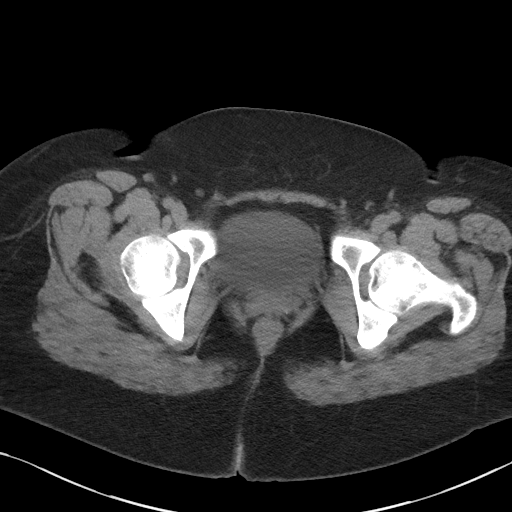
[im 24/96  soft-tissue]
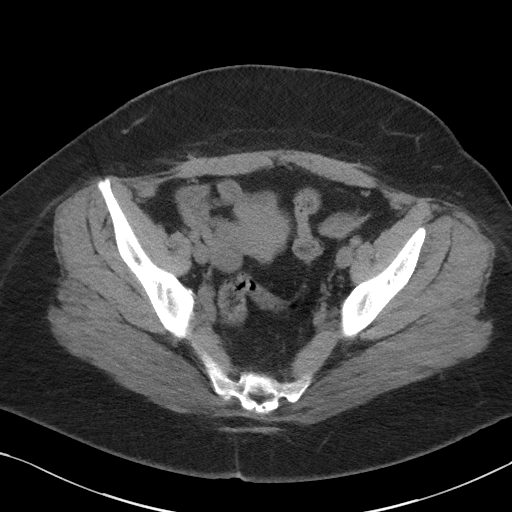

[Series 3: coronal pre · coronal · non-contrast · 0.95mm/px · 3 of 101 slices shown, 4 images]
[im 26/101  soft-tissue]
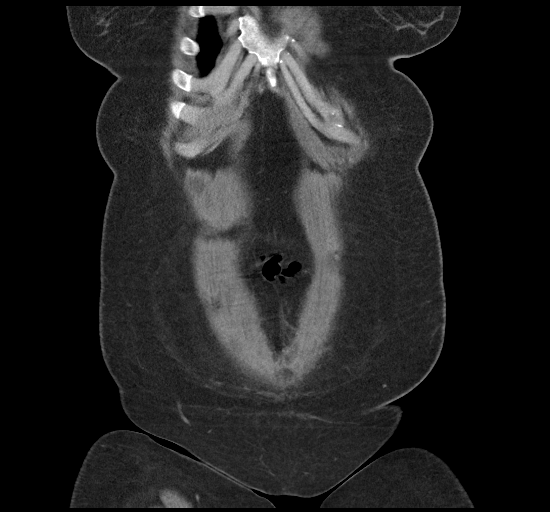
[im 51/101  soft-tissue]
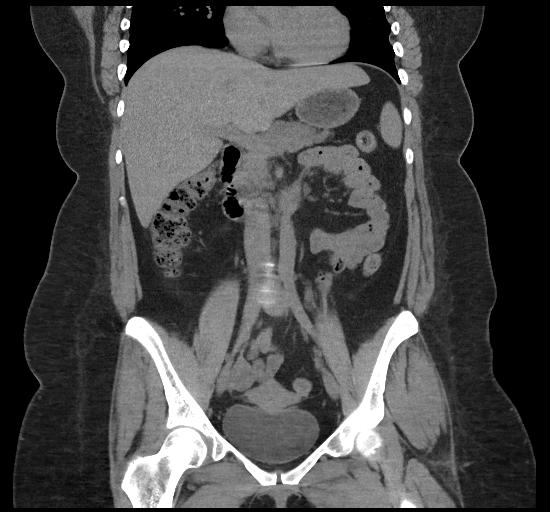
[im 51/101  bone]
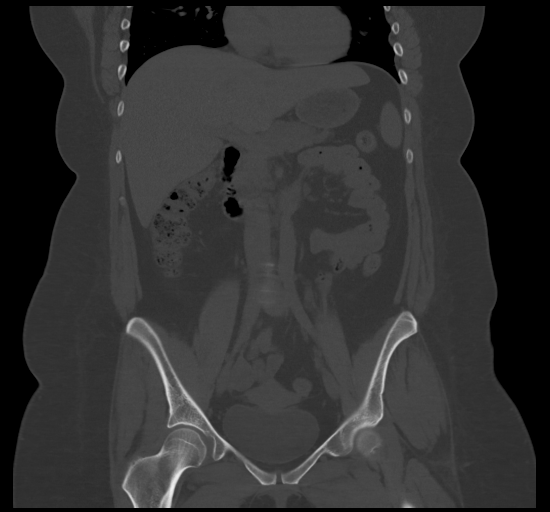
[im 76/101  soft-tissue]
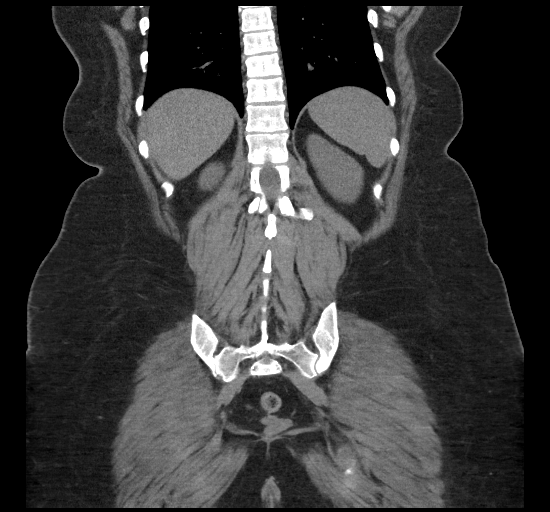

[Series 8: axial delay · axial · delayed · 0.88mm/px · z∈[-461,-41]mm · 7 of 113 slices shown, 12 images]
[im 15/113  soft-tissue]
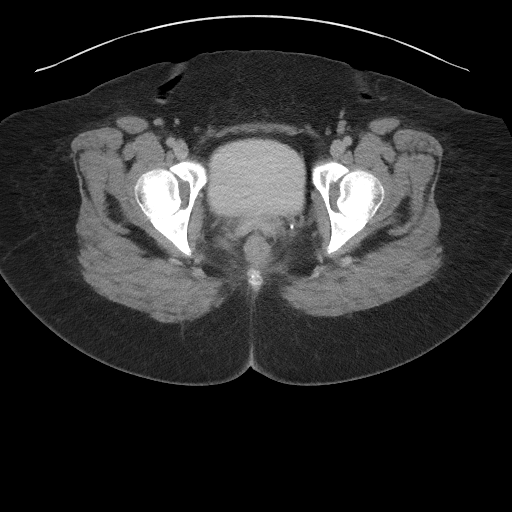
[im 15/113  bone]
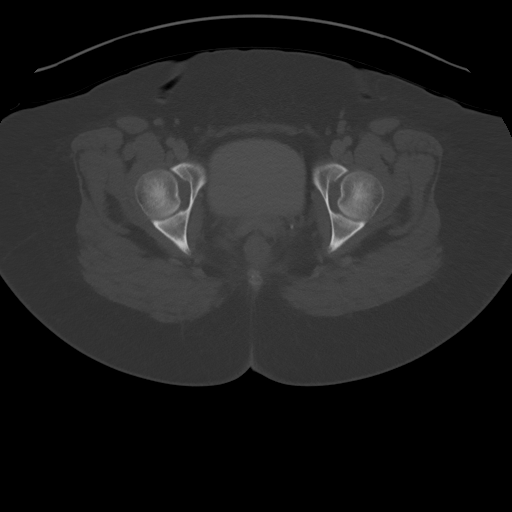
[im 29/113  soft-tissue]
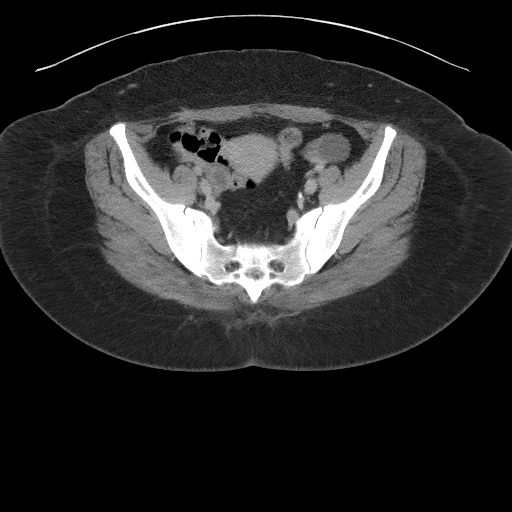
[im 43/113  soft-tissue]
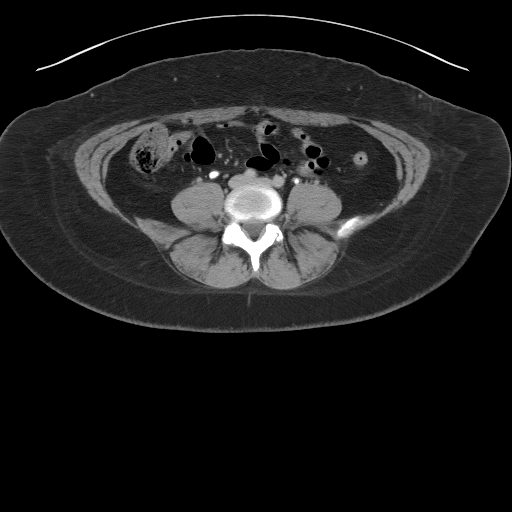
[im 57/113  soft-tissue]
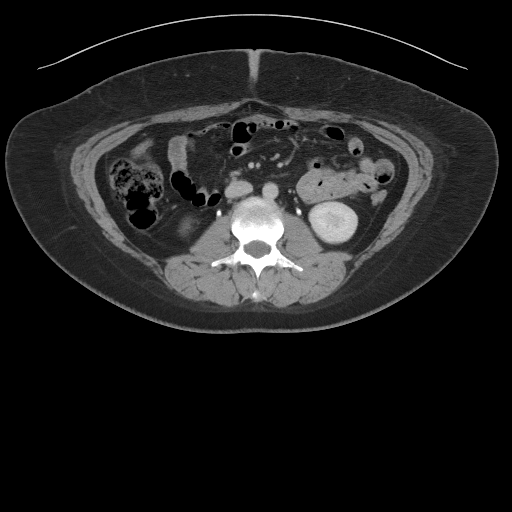
[im 57/113  lung]
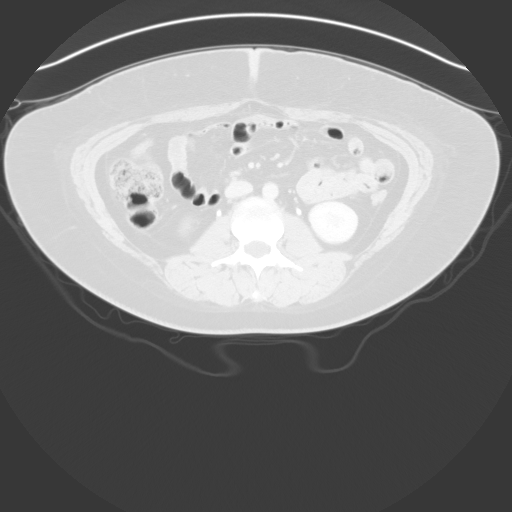
[im 71/113  soft-tissue]
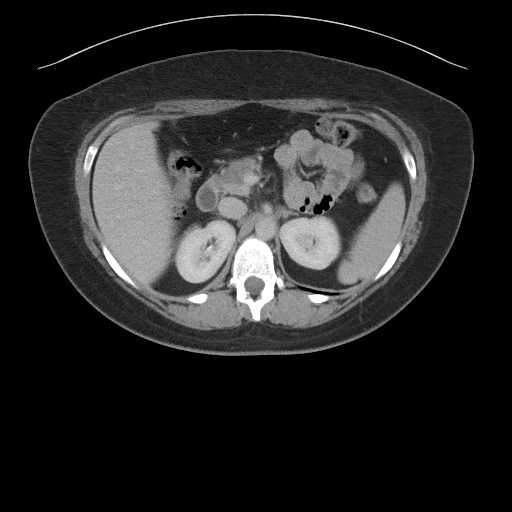
[im 71/113  lung]
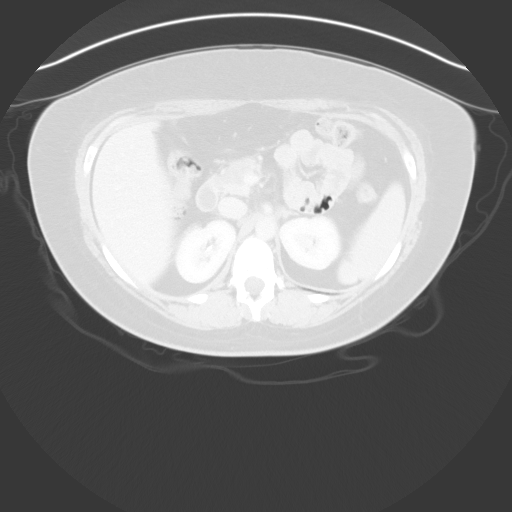
[im 85/113  soft-tissue]
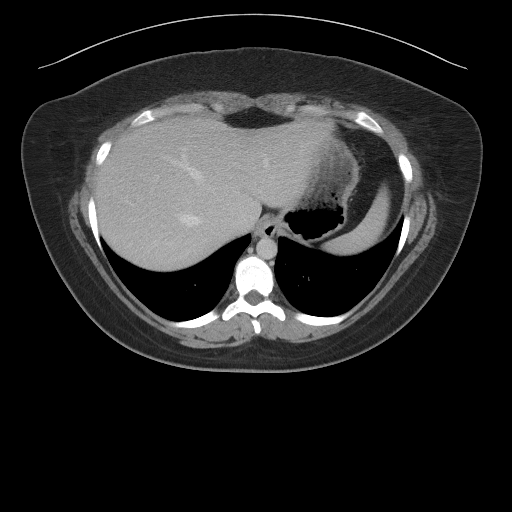
[im 85/113  lung]
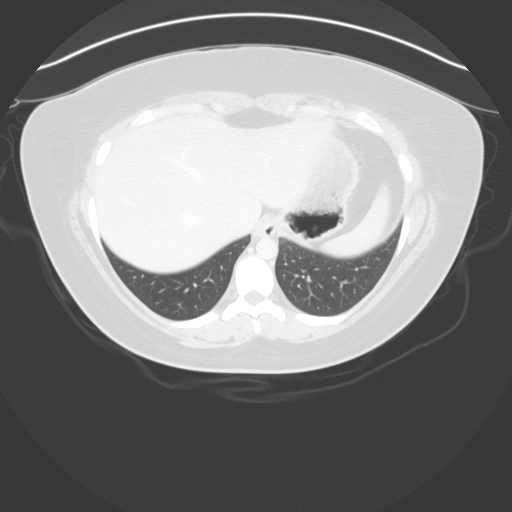
[im 99/113  soft-tissue]
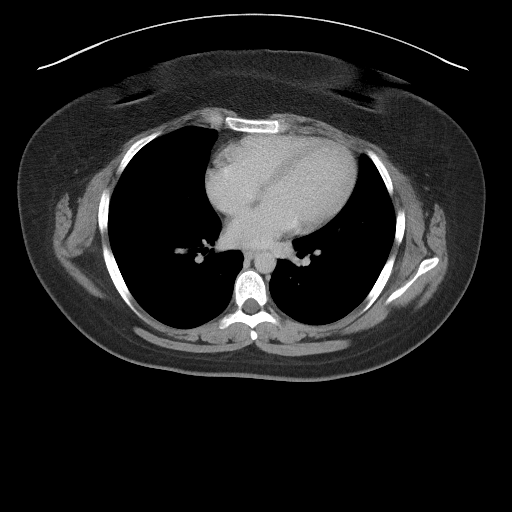
[im 99/113  lung]
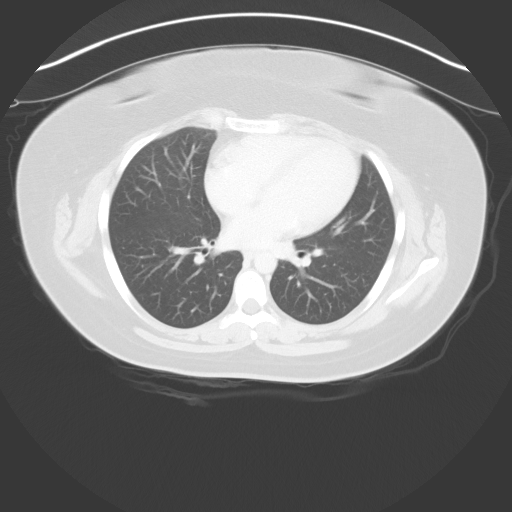

[12 of 46 positions shown; findings below may reference images not displayed]

FINDINGS: Lower chest: No acute abnormality.

Hepatobiliary: No suspicious hepatic lesion. Gallbladder is
unremarkable. No biliary ductal dilation.

Pancreas: No pancreatic ductal dilation or evidence of acute
inflammation.

Spleen: Within normal limits.

Adrenals/Urinary Tract: Bilateral adrenal glands appear normal.

No hydronephrosis. No renal, ureteral or bladder calculi identified.

No solid enhancing renal mass.

The kidneys demonstrate symmetric enhancement and excretion of
contrast material. No collecting system duplication. No suspicious
filling defect visualized within the opacified portions of the
collecting systems or ureters on delayed imaging. However, the
distal bilateral ureters are not well opacified limiting evaluation.

The urinary bladder is unremarkable for degree of distension without
asymmetric wall thickening or suspicious intraluminal filling defect
identified.

Stomach/Bowel: No enteric contrast was administered. Stomach is
unremarkable for degree of distension. No pathologic dilation of
small or large bowel. The terminal ileum appears normal. The
appendix is not confidently identified however there is no pericecal
inflammation. No evidence of acute bowel inflammation.

Vascular/Lymphatic: No abdominal aortic aneurysm. No pathologically
enlarged abdominal or pelvic lymph nodes.

Reproductive: Changes in the wall of the anterior lower uterine
segment, consistent with prior C-section. No suspicious adnexal
lesion.

Other: No abdominopelvic free fluid.

Musculoskeletal: No acute or significant osseous findings.
IMPRESSION: 1. No hydronephrosis. No renal, ureteral or bladder calculi
identified.
2. No solid enhancing renal mass.

## 2024-04-22 DIAGNOSIS — Z79899 Other long term (current) drug therapy: Secondary | ICD-10-CM | POA: Diagnosis not present

## 2024-04-22 DIAGNOSIS — L237 Allergic contact dermatitis due to plants, except food: Secondary | ICD-10-CM | POA: Diagnosis not present

## 2024-04-22 DIAGNOSIS — R42 Dizziness and giddiness: Secondary | ICD-10-CM | POA: Diagnosis not present

## 2024-04-22 DIAGNOSIS — F909 Attention-deficit hyperactivity disorder, unspecified type: Secondary | ICD-10-CM | POA: Diagnosis not present

## 2024-04-24 DIAGNOSIS — L237 Allergic contact dermatitis due to plants, except food: Secondary | ICD-10-CM | POA: Diagnosis not present

## 2024-05-06 DIAGNOSIS — L989 Disorder of the skin and subcutaneous tissue, unspecified: Secondary | ICD-10-CM | POA: Diagnosis not present

## 2024-05-06 DIAGNOSIS — F901 Attention-deficit hyperactivity disorder, predominantly hyperactive type: Secondary | ICD-10-CM | POA: Diagnosis not present
